# Patient Record
Sex: Female | Born: 1965 | Race: White | Hispanic: No | Marital: Married | Smoking: Never smoker
Health system: Southern US, Community
[De-identification: ages and names within clinical notes are randomized; demographics above are authoritative.]

## PROBLEM LIST (undated history)

## (undated) DIAGNOSIS — M1990 Unspecified osteoarthritis, unspecified site: Secondary | ICD-10-CM

## (undated) DIAGNOSIS — Z8489 Family history of other specified conditions: Secondary | ICD-10-CM

## (undated) DIAGNOSIS — F3281 Premenstrual dysphoric disorder: Secondary | ICD-10-CM

## (undated) DIAGNOSIS — L732 Hidradenitis suppurativa: Secondary | ICD-10-CM

## (undated) DIAGNOSIS — M797 Fibromyalgia: Secondary | ICD-10-CM

## (undated) DIAGNOSIS — K219 Gastro-esophageal reflux disease without esophagitis: Secondary | ICD-10-CM

## (undated) DIAGNOSIS — E785 Hyperlipidemia, unspecified: Secondary | ICD-10-CM

## (undated) DIAGNOSIS — N6019 Diffuse cystic mastopathy of unspecified breast: Secondary | ICD-10-CM

## (undated) DIAGNOSIS — D649 Anemia, unspecified: Secondary | ICD-10-CM

## (undated) DIAGNOSIS — Z9889 Other specified postprocedural states: Secondary | ICD-10-CM

## (undated) DIAGNOSIS — E220 Acromegaly and pituitary gigantism: Secondary | ICD-10-CM

## (undated) DIAGNOSIS — L709 Acne, unspecified: Secondary | ICD-10-CM

## (undated) DIAGNOSIS — G4730 Sleep apnea, unspecified: Secondary | ICD-10-CM

## (undated) DIAGNOSIS — F419 Anxiety disorder, unspecified: Secondary | ICD-10-CM

## (undated) DIAGNOSIS — R112 Nausea with vomiting, unspecified: Secondary | ICD-10-CM

## (undated) DIAGNOSIS — E042 Nontoxic multinodular goiter: Secondary | ICD-10-CM

## (undated) HISTORY — PX: HERNIA REPAIR: SHX51

## (undated) HISTORY — DX: Hyperlipidemia, unspecified: E78.5

## (undated) HISTORY — DX: Diffuse cystic mastopathy of unspecified breast: N60.19

## (undated) HISTORY — DX: Premenstrual dysphoric disorder: F32.81

## (undated) HISTORY — DX: Nontoxic multinodular goiter: E04.2

## (undated) HISTORY — PX: WISDOM TOOTH EXTRACTION: SHX21

## (undated) HISTORY — PX: CHOLECYSTECTOMY: SHX55

## (undated) HISTORY — DX: Acne, unspecified: L70.9

---

## 1998-04-16 ENCOUNTER — Other Ambulatory Visit: Admission: RE | Admit: 1998-04-16 | Discharge: 1998-04-16 | Payer: Self-pay | Admitting: Obstetrics and Gynecology

## 1998-07-08 ENCOUNTER — Inpatient Hospital Stay (HOSPITAL_COMMUNITY): Admission: AD | Admit: 1998-07-08 | Discharge: 1998-07-10 | Payer: Self-pay | Admitting: Obstetrics and Gynecology

## 1998-07-11 ENCOUNTER — Encounter (HOSPITAL_COMMUNITY): Admission: RE | Admit: 1998-07-11 | Discharge: 1998-10-09 | Payer: Self-pay | Admitting: Obstetrics and Gynecology

## 1999-02-14 ENCOUNTER — Other Ambulatory Visit: Admission: RE | Admit: 1999-02-14 | Discharge: 1999-02-14 | Payer: Self-pay | Admitting: Obstetrics and Gynecology

## 2000-04-27 ENCOUNTER — Other Ambulatory Visit: Admission: RE | Admit: 2000-04-27 | Discharge: 2000-04-27 | Payer: Self-pay | Admitting: Obstetrics and Gynecology

## 2001-07-28 ENCOUNTER — Other Ambulatory Visit: Admission: RE | Admit: 2001-07-28 | Discharge: 2001-07-28 | Payer: Self-pay | Admitting: Obstetrics and Gynecology

## 2001-12-13 ENCOUNTER — Encounter (INDEPENDENT_AMBULATORY_CARE_PROVIDER_SITE_OTHER): Payer: Self-pay | Admitting: *Deleted

## 2001-12-13 ENCOUNTER — Ambulatory Visit (HOSPITAL_BASED_OUTPATIENT_CLINIC_OR_DEPARTMENT_OTHER): Admission: RE | Admit: 2001-12-13 | Discharge: 2001-12-13 | Payer: Self-pay | Admitting: General Surgery

## 2001-12-13 ENCOUNTER — Encounter: Payer: Self-pay | Admitting: General Surgery

## 2001-12-13 ENCOUNTER — Encounter: Admission: RE | Admit: 2001-12-13 | Discharge: 2001-12-13 | Payer: Self-pay | Admitting: General Surgery

## 2004-01-04 ENCOUNTER — Other Ambulatory Visit: Admission: RE | Admit: 2004-01-04 | Discharge: 2004-01-04 | Payer: Self-pay | Admitting: Family Medicine

## 2004-01-19 ENCOUNTER — Encounter: Admission: RE | Admit: 2004-01-19 | Discharge: 2004-01-19 | Payer: Self-pay | Admitting: Family Medicine

## 2004-12-08 HISTORY — PX: CARPAL TUNNEL RELEASE: SHX101

## 2005-05-15 ENCOUNTER — Other Ambulatory Visit: Admission: RE | Admit: 2005-05-15 | Discharge: 2005-05-15 | Payer: Self-pay | Admitting: Family Medicine

## 2005-05-15 LAB — HM PAP SMEAR: HM Pap smear: NEGATIVE

## 2005-07-21 ENCOUNTER — Encounter: Admission: RE | Admit: 2005-07-21 | Discharge: 2005-07-21 | Payer: Self-pay | Admitting: Orthopedic Surgery

## 2005-07-23 ENCOUNTER — Ambulatory Visit (HOSPITAL_COMMUNITY): Admission: RE | Admit: 2005-07-23 | Discharge: 2005-07-23 | Payer: Self-pay | Admitting: Orthopedic Surgery

## 2005-07-23 ENCOUNTER — Ambulatory Visit (HOSPITAL_BASED_OUTPATIENT_CLINIC_OR_DEPARTMENT_OTHER): Admission: RE | Admit: 2005-07-23 | Discharge: 2005-07-23 | Payer: Self-pay | Admitting: Orthopedic Surgery

## 2005-12-09 ENCOUNTER — Ambulatory Visit (HOSPITAL_BASED_OUTPATIENT_CLINIC_OR_DEPARTMENT_OTHER): Admission: RE | Admit: 2005-12-09 | Discharge: 2005-12-09 | Payer: Self-pay | Admitting: Orthopedic Surgery

## 2006-08-21 ENCOUNTER — Encounter: Admission: RE | Admit: 2006-08-21 | Discharge: 2006-08-21 | Payer: Self-pay | Admitting: Obstetrics and Gynecology

## 2007-02-23 ENCOUNTER — Ambulatory Visit: Payer: Self-pay | Admitting: Family Medicine

## 2007-04-09 ENCOUNTER — Ambulatory Visit: Payer: Self-pay

## 2007-09-30 ENCOUNTER — Ambulatory Visit: Payer: Self-pay | Admitting: Family Medicine

## 2007-12-13 ENCOUNTER — Ambulatory Visit: Payer: Self-pay | Admitting: Family Medicine

## 2008-08-02 LAB — HM MAMMOGRAPHY

## 2008-08-29 ENCOUNTER — Ambulatory Visit: Payer: Self-pay | Admitting: Family Medicine

## 2008-09-13 ENCOUNTER — Ambulatory Visit: Payer: Self-pay | Admitting: Family Medicine

## 2008-11-17 ENCOUNTER — Ambulatory Visit: Payer: Self-pay | Admitting: Family Medicine

## 2009-05-31 ENCOUNTER — Ambulatory Visit: Payer: Self-pay | Admitting: Family Medicine

## 2009-08-24 ENCOUNTER — Ambulatory Visit: Payer: Self-pay | Admitting: Family Medicine

## 2009-09-19 ENCOUNTER — Ambulatory Visit: Payer: Self-pay | Admitting: Family Medicine

## 2010-11-06 ENCOUNTER — Ambulatory Visit: Payer: Self-pay | Admitting: Family Medicine

## 2010-11-26 ENCOUNTER — Ambulatory Visit: Payer: Self-pay | Admitting: Family Medicine

## 2011-02-05 ENCOUNTER — Other Ambulatory Visit (HOSPITAL_COMMUNITY): Payer: Self-pay | Admitting: Obstetrics and Gynecology

## 2011-02-05 DIAGNOSIS — R140 Abdominal distension (gaseous): Secondary | ICD-10-CM

## 2011-02-05 DIAGNOSIS — R1011 Right upper quadrant pain: Secondary | ICD-10-CM

## 2011-02-14 ENCOUNTER — Ambulatory Visit (HOSPITAL_COMMUNITY)
Admission: RE | Admit: 2011-02-14 | Discharge: 2011-02-14 | Disposition: A | Discharge status: Home / Self Care | Payer: BC Managed Care – PPO | Source: Ambulatory Visit | Attending: Obstetrics and Gynecology | Admitting: Obstetrics and Gynecology

## 2011-02-14 DIAGNOSIS — R142 Eructation: Secondary | ICD-10-CM | POA: Insufficient documentation

## 2011-02-14 DIAGNOSIS — R141 Gas pain: Secondary | ICD-10-CM | POA: Insufficient documentation

## 2011-02-14 DIAGNOSIS — R140 Abdominal distension (gaseous): Secondary | ICD-10-CM

## 2011-02-14 DIAGNOSIS — R1011 Right upper quadrant pain: Secondary | ICD-10-CM | POA: Insufficient documentation

## 2011-04-25 NOTE — Op Note (Signed)
[place]  Patient:    Rebecca Vargas, Rebecca Vargas Visit Number: [id number] MRN: [id number]          Service Type: DSU Location: [facility] Attending Physician:  Tempie Donning Dictated by:   Gita Kudo, M.D. Proc. Date: 12/13/01 Admit Date:  12/13/2001   CC:         Elpidio Galea, P.A., office of Dr. Sharlot Gowda  Ronnald Nian, M.D.  Dr. Stephani Police, [facility] Radiology   Operative Report  PREOPERATIVE DIAGNOSIS:  Left breast mass, probably benign.  POSTOPERATIVE DIAGNOSIS:  Left breast mass, probably benign, pending pathology.  PROCEDURE:  Excisional biopsy, left breast mass.  SURGEON:  Gita Kudo, M.D.  ANESTHESIA:  MAC, IV sedation, local 1% sedation.  CLINICAL SUMMARY:  A 45 year old female with a small nodule palpated in her left breast.  Had her first mammogram ever that was basically negative except for some density in the UOQ on the left, and ultrasound showed a nodule there. It looked like a benign fibroadenoma, and options given to the patient and she requested total excision.  OPERATIVE FINDINGS:  The lesion was localized by wire at the breast center. Then the patient underwent intravenous sedation and her breast was prepped and draped.  DESCRIPTION OF PROCEDURE:  After suitable prepping and draping, a curved incision made centered over the marked area by radiology in the UOQ on the left.  Then in the breast tissue, a dissection made to find the wire, which entered at approximately 6 oclock.  This was found, cut close to the skin, and pulled through.  Then using the wire as a guide, a core of tissue around the wire and deep to the tip was excised.  This was then marked with a suture and sent for ultrasound confirmation.  The wound meanwhile was lavaged with saline, made dry by cautery, and closed with 3-0 Vicryl and 4-0 nylon.  A sterile absorbent dressing was applied when the report came back from x-ray that the  lesion had been removed.  There were no complications. Dictated by:   Gita Kudo, M.D. Attending Physician:  Tempie Donning DD:  12/13/01 TD:  12/13/01 Job: [id number]

## 2011-04-25 NOTE — Op Note (Signed)
NAME:  Rebecca Vargas, Rebecca Vargas                ACCOUNT NO.:  [id number]   MEDICAL RECORD NO.:  [id number]          PATIENT TYPE:  AMB   LOCATION:  DSC                          FACILITY:  [facility]   PHYSICIAN:  Cindee Salt, M.D.       DATE OF BIRTH:  [birth year]   DATE OF PROCEDURE:  07/23/2005  DATE OF DISCHARGE:                                 OPERATIVE REPORT   PREOPERATIVE DIAGNOSIS:  Carpal tunnel syndrome, left hand.   POSTOPERATIVE DIAGNOSIS:  Carpal tunnel syndrome, left hand.   OPERATION:  Decompression left median nerve.   SURGEON:  Kuzma.   ASSISTANT:  Carolyne Fiscal R.N.   ANESTHESIA:  Forearm based IV regional.   HISTORY:  The patient is a 44 year old female with a history of carpal  tunnel syndrome EMG nerve conductions positive. This has not responded to  conservative treatment. She has elected to have this released. She is aware  of risks and complications.  The patient was marked in the recovery room by  both surgeon and the patient.  She was brought to the operating room where a  forearm based IV regional anesthetic was carried out without difficulty. She  was prepped using DuraPrep, supine position, left arm free. A longitudinal  incision was made in the palm and carried down through subcutaneous tissue.  Bleeders were electrocauterized. Palmar fascia was split, superficial palmar  arch identified.  Flexor tendon to the ring and little finger identified to  the ulnar side of the median nerve.  The carpal retinaculum was incised with  sharp dissection. The right angle and Sewell retractor were placed between  skin forearm fascia. The fascia was released for approximately a centimeter  and a half proximal to the wrist crease under direct vision. Canal was  explored.  Tenosynovial tissue moderately thickened.  No further lesions  were identified. The wound was irrigated. Skin was closed with interrupted 5-  0 nylon sutures. A sterile compressive dressing and splint was applied. The  patient tolerated procedure well and was taken to the recovery room for  observation in satisfactory condition. She is discharged home to return to  the [facility] of [place] in one week on Vicodin.           ______________________________  Cindee Salt, M.D.     GK/MEDQ  D:  07/23/2005  T:  07/23/2005  Job:  [id number]

## 2011-04-25 NOTE — Op Note (Signed)
NAME:  Rebecca Vargas, Rebecca Vargas                ACCOUNT NO.:  [id number]   MEDICAL RECORD NO.:  [id number]          PATIENT TYPE:  AMB   LOCATION:  DSC                          FACILITY:  [facility]   PHYSICIAN:  Cindee Salt, M.D.       DATE OF BIRTH:  09/15/1966   DATE OF PROCEDURE:  12/09/2005  DATE OF DISCHARGE:                                 OPERATIVE REPORT   PREOPERATIVE DIAGNOSIS:  Carpal tunnel syndrome, right hand.   POSTOPERATIVE DIAGNOSIS:  Carpal tunnel syndrome, right hand.   OPERATION:  Decompression of right median nerve.   SURGEON:  Cindee Salt, M.D.   ASSISTANT:  Joaquin Courts R.N.   ANESTHESIA:  Forearm-based IV regional.   HISTORY:  The patient is a 45 year old female with a history of carpal  tunnel syndrome, EMG nerve conductions positive.  This has not responded to  conservative treatment.   PROCEDURE:  The patient's arm was marked by both the patient and surgeon in  the preoperative area.  She was taken to the operating room, where a forearm-  based IV regional anesthetic was carried out without difficulty.  She was  prepped using DuraPrep, supine position, right arm free.  A longitudinal  incision was made in the palm, carried down through subcutaneous tissue.  Bleeders were electrocauterized.  Palmar fascia was split, superficial  palmar arch identified, the flexor tendon to the ring and little finger  identified to the ulnar side of the median nerve.  The carpal retinaculum  was incised with sharp dissection, right angle and Sewall retractor were  placed between skin and forearm fascia.  The fascia was released for  approximately 1.5 cm proximal to the wrist crease under direct vision.  The  canal was explored, tenosynovial tissue was thickened.  Persistent median  artery was present.  No further lesions were identified.  The wound was  irrigated.  The skin was closed with interrupted 5-0 nylon sutures.  A  sterile compressive dressing and splint was applied.  The patient  tolerated  the procedure well and was taken to the recovery room for observation in  satisfactory condition.  She is discharged home to return to the [facility] [place] in one week on Vicodin.           ______________________________  Cindee Salt, M.D.     GK/MEDQ  D:  12/09/2005  T:  12/09/2005  Job:  [id number]

## 2011-09-26 ENCOUNTER — Other Ambulatory Visit (INDEPENDENT_AMBULATORY_CARE_PROVIDER_SITE_OTHER): Payer: BC Managed Care – PPO

## 2011-09-26 DIAGNOSIS — Z23 Encounter for immunization: Secondary | ICD-10-CM

## 2011-10-15 ENCOUNTER — Ambulatory Visit (INDEPENDENT_AMBULATORY_CARE_PROVIDER_SITE_OTHER): Payer: BC Managed Care – PPO | Admitting: Medical

## 2011-10-15 ENCOUNTER — Ambulatory Visit
Admission: RE | Admit: 2011-10-15 | Discharge: 2011-10-15 | Disposition: A | Discharge status: Home / Self Care | Payer: BC Managed Care – PPO | Source: Ambulatory Visit | Attending: Medical | Admitting: Medical

## 2011-10-15 ENCOUNTER — Encounter: Payer: Self-pay | Admitting: Medical

## 2011-10-15 ENCOUNTER — Encounter: Payer: Self-pay | Admitting: Family Medicine

## 2011-10-15 ENCOUNTER — Telehealth: Payer: Self-pay | Admitting: Family Medicine

## 2011-10-15 VITALS — BP 120/82 | HR 72 | Temp 98.1°F | Resp 18 | Wt 181.0 lb

## 2011-10-15 DIAGNOSIS — G43909 Migraine, unspecified, not intractable, without status migrainosus: Secondary | ICD-10-CM

## 2011-10-15 DIAGNOSIS — M542 Cervicalgia: Secondary | ICD-10-CM | POA: Insufficient documentation

## 2011-10-15 MED ORDER — BACLOFEN 10 MG PO TABS: ORAL_TABLET | ORAL | Status: DC
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
## 2011-10-16 NOTE — Telephone Encounter (Signed)
DONE

## 2012-10-01 ENCOUNTER — Other Ambulatory Visit (INDEPENDENT_AMBULATORY_CARE_PROVIDER_SITE_OTHER): Payer: BC Managed Care – PPO

## 2012-10-01 DIAGNOSIS — Z23 Encounter for immunization: Secondary | ICD-10-CM

## 2013-06-03 ENCOUNTER — Ambulatory Visit (INDEPENDENT_AMBULATORY_CARE_PROVIDER_SITE_OTHER): Payer: BC Managed Care – PPO | Admitting: Medical

## 2013-06-03 ENCOUNTER — Encounter: Payer: Self-pay | Admitting: Medical

## 2013-06-03 VITALS — BP 110/80 | HR 68 | Temp 98.4°F | Resp 16 | Wt 190.0 lb

## 2013-06-03 DIAGNOSIS — F40298 Other specified phobia: Secondary | ICD-10-CM

## 2013-06-03 DIAGNOSIS — F40243 Fear of flying: Secondary | ICD-10-CM

## 2013-06-03 DIAGNOSIS — F430 Acute stress reaction: Secondary | ICD-10-CM

## 2013-06-03 MED ORDER — ALPRAZOLAM 0.5 MG PO TABS: 0.5000 mg | ORAL_TABLET | Freq: Every evening | ORAL | Status: DC | PRN

## 2013-06-03 NOTE — Progress Notes (Signed)
Subjective: Here for request for short term Xanax.   In the past use to have Xanax prescription for rare stressful occasions.   Her family and several other families are leaving for a vacation soon in [place] where they will be sharing housing.   In general she doesn't like getting on planes or be trapped with family or others for length of time.  This makes her stressed and on edge.   In addition, they are all working to have an itinerary, and husband usually pouts if things don't go as planned even though she is the one responsible with putting together hotel, car, etc.  Last used 3 tablets recent for dental work through [employer], [facility], but prior to that last used Xanax 3 years ago.  Takes her Celexa daily, this works well.  Her gynecologist prescribed the Celexa.    Past Medical History  Diagnosis Date  . Dyslipidemia   . PMDD (premenstrual dysphoric disorder)   . Acne   . Fibrocystic breast disease in female   . Multinodular goiter   . Migraine    ROS as in subjective  Objective: Gen: wd, wn, nad Psych: pleasant, good eye contact  Assessment: Encounter Diagnoses  Name Primary?  . Acute stress reaction Yes  . Fear of flying     Plan: Short term prn use of xanax prescribed, counseled on ways to make this a vacation and not a stressful event, counseled about stress with flying.  Her gynecologist prescribed her Celexa. Advised that the xanax is short term, and not meant to be an ongoing prescription.

## 2013-10-03 ENCOUNTER — Other Ambulatory Visit (INDEPENDENT_AMBULATORY_CARE_PROVIDER_SITE_OTHER): Payer: BC Managed Care – PPO

## 2013-10-03 DIAGNOSIS — Z23 Encounter for immunization: Secondary | ICD-10-CM

## 2014-09-18 ENCOUNTER — Other Ambulatory Visit: Payer: BC Managed Care – PPO

## 2014-09-19 ENCOUNTER — Other Ambulatory Visit (INDEPENDENT_AMBULATORY_CARE_PROVIDER_SITE_OTHER): Payer: BC Managed Care – PPO

## 2014-09-19 DIAGNOSIS — Z23 Encounter for immunization: Secondary | ICD-10-CM

## 2016-09-01 ENCOUNTER — Other Ambulatory Visit (INDEPENDENT_AMBULATORY_CARE_PROVIDER_SITE_OTHER): Payer: BLUE CROSS/BLUE SHIELD

## 2016-09-01 DIAGNOSIS — Z23 Encounter for immunization: Secondary | ICD-10-CM

## 2019-11-18 ENCOUNTER — Other Ambulatory Visit: Payer: Self-pay

## 2019-11-18 DIAGNOSIS — Z20822 Contact with and (suspected) exposure to covid-19: Secondary | ICD-10-CM

## 2019-11-20 LAB — NOVEL CORONAVIRUS, NAA: SARS-CoV-2, NAA: NOT DETECTED

## 2020-01-02 ENCOUNTER — Other Ambulatory Visit: Payer: Self-pay | Admitting: Sports Medicine

## 2020-01-02 DIAGNOSIS — G8929 Other chronic pain: Secondary | ICD-10-CM

## 2020-01-02 DIAGNOSIS — M545 Low back pain, unspecified: Secondary | ICD-10-CM

## 2020-01-05 ENCOUNTER — Ambulatory Visit
Admission: RE | Admit: 2020-01-05 | Discharge: 2020-01-05 | Disposition: A | Discharge status: Home / Self Care | Payer: 59 | Source: Ambulatory Visit | Attending: Sports Medicine | Admitting: Sports Medicine

## 2020-01-05 DIAGNOSIS — G8929 Other chronic pain: Secondary | ICD-10-CM

## 2020-01-05 MED ORDER — METHYLPREDNISOLONE ACETATE 40 MG/ML INJ SUSP (RADIOLOG
120.0000 mg | Freq: Once | INTRAMUSCULAR | Status: AC
  Administered 2020-01-05: 120 mg via EPIDURAL

## 2020-01-05 MED ORDER — IOPAMIDOL (ISOVUE-M 200) INJECTION 41%
1.0000 mL | Freq: Once | INTRAMUSCULAR | Status: AC
  Administered 2020-01-05: 1 mL via EPIDURAL

## 2020-01-05 NOTE — Discharge Instructions (Signed)

## 2020-01-17 ENCOUNTER — Other Ambulatory Visit: Payer: Self-pay | Admitting: Sports Medicine

## 2020-01-17 DIAGNOSIS — G8929 Other chronic pain: Secondary | ICD-10-CM

## 2020-01-23 ENCOUNTER — Other Ambulatory Visit: Payer: Self-pay

## 2020-01-23 ENCOUNTER — Ambulatory Visit
Admission: RE | Admit: 2020-01-23 | Discharge: 2020-01-23 | Disposition: A | Discharge status: Home / Self Care | Payer: 59 | Source: Ambulatory Visit | Attending: Sports Medicine | Admitting: Sports Medicine

## 2020-01-23 DIAGNOSIS — G8929 Other chronic pain: Secondary | ICD-10-CM

## 2020-01-23 MED ORDER — METHYLPREDNISOLONE ACETATE 40 MG/ML INJ SUSP (RADIOLOG
120.0000 mg | Freq: Once | INTRAMUSCULAR | Status: AC
  Administered 2020-01-23: 10:00:00 120 mg via EPIDURAL

## 2020-01-23 MED ORDER — IOPAMIDOL (ISOVUE-M 200) INJECTION 41%
1.0000 mL | Freq: Once | INTRAMUSCULAR | Status: AC
  Administered 2020-01-23: 1 mL via EPIDURAL

## 2021-01-15 ENCOUNTER — Other Ambulatory Visit: Payer: Self-pay | Admitting: Obstetrics and Gynecology

## 2021-01-21 ENCOUNTER — Other Ambulatory Visit: Payer: Self-pay | Admitting: Obstetrics and Gynecology

## 2021-01-21 DIAGNOSIS — N289 Disorder of kidney and ureter, unspecified: Secondary | ICD-10-CM

## 2021-01-29 ENCOUNTER — Ambulatory Visit
Admission: RE | Admit: 2021-01-29 | Discharge: 2021-01-29 | Disposition: A | Discharge status: Home / Self Care | Payer: 59 | Source: Ambulatory Visit | Attending: Obstetrics and Gynecology | Admitting: Obstetrics and Gynecology

## 2021-01-29 ENCOUNTER — Other Ambulatory Visit: Payer: Self-pay | Admitting: Orthopaedic Surgery

## 2021-01-29 DIAGNOSIS — Z01818 Encounter for other preprocedural examination: Secondary | ICD-10-CM

## 2021-01-29 DIAGNOSIS — N289 Disorder of kidney and ureter, unspecified: Secondary | ICD-10-CM

## 2021-02-01 NOTE — Progress Notes (Addendum)
COVID Vaccine Completed:  x3 Date COVID Vaccine completed: Has received booster:  11-21 COVID vaccine manufacturer: Pfizer    [facility] [employer]   Date of COVID positive in last 90 days:    PCP - Smitty Cords [place] Cardiologist - N/A  Chest x-ray - 02-05-21 Epic EKG - 02-04-21 [place] Stress Test -  ECHO - 2016 to evaluate heart murmur Cardiac Cath -  Pacemaker/ICD device last checked:  Sleep Study - 10-05-18 Care Everywhere, +sleep apnea CPAP - yes  Fasting Blood Sugar - N/A Checks Blood Sugar _____ times a day  Blood Thinner Instructions:  N/A Aspirin Instructions: Last Dose:  Activity level:  Can go up a flight of stairs and perform activities of daily living without stopping and without symptoms        Anesthesia review:  N/A  Patient denies shortness of breath, fever, cough and chest pain at PAT appointment   Patient verbalized understanding of instructions that were given to them at the PAT appointment. Patient was also instructed that they will need to review over the PAT instructions again at home before surgery.

## 2021-02-01 NOTE — Progress Notes (Signed)
Please enter orders for PAT visit scheduled for 02-05-21. 

## 2021-02-01 NOTE — Patient Instructions (Addendum)
DUE TO COVID-19 ONLY ONE VISITOR IS ALLOWED TO COME WITH YOU AND STAY IN THE WAITING ROOM ONLY DURING PRE OP AND PROCEDURE.   IF YOU WILL BE ADMITTED INTO THE HOSPITAL YOU ARE ALLOWED ONLY TWO SUPPORT PEOPLE DURING VISITATION HOURS ONLY (10AM -8PM)   . The support person(s) may change daily. . The support person(s) must pass our screening, gel in and out, and wear a mask at all times, including in the patient's room. . Patients must also wear a mask when staff or their support person are in the room.  No visitors under the age of 55. Any visitor under the age of 55 must be accompanied by an adult.    COVID SWAB TESTING MUST BE COMPLETED ON:   Friday, 02-08-21 @ 2:50 PM   [address] [place], [place] [id number]  (Must self quarantine after testing. Follow instructions on handout.)         Your procedure is scheduled on:  Tuesday, 02-12-21   Report to [facility] Main  Entrance    Report to admitting at 10:00 AM   Call this number if you have problems the morning of surgery [phone]   Do not eat food :After Midnight.   May have liquids until 9:15 AM day of surgery  CLEAR LIQUID DIET  Foods Allowed                                                                     Foods Excluded  Water, Black Coffee and tea, regular and decaf              liquids that you cannot  Plain Jell-O in any flavor  (No red)                                     see through such as: Fruit ices (not with fruit pulp)                                      milk, soups, orange juice              Iced Popsicles (No red)                                      All solid food                                   Apple juices Sports drinks like Gatorade (No red) Lightly seasoned clear broth or consume(fat free) Sugar, honey syrup     Complete one Ensure drink the morning of surgery at 9:15 AM  the day of surgery.        1. The day of surgery:  ? Drink ONE (1) Pre-Surgery Clear Ensure or G2 by am the  morning of surgery. Drink in one sitting. Do not sip.  ? This drink was given to you during your hospital  pre-op appointment  visit. ? Nothing else to drink after completing the  Pre-Surgery Clear Ensure or G2.          If you have questions, please contact your surgeon's office.     Oral Hygiene is also important to reduce your risk of infection.                                    Remember - BRUSH YOUR TEETH THE MORNING OF SURGERY WITH YOUR REGULAR TOOTHPASTE   Do NOT smoke after Midnight   Take these medicines the morning of surgery with A SIP OF WATER:  Duloxetine, Gabapentin, Omeprazole, Topiramate                              You may not have any metal on your body including hair pins, jewelry, and body piercings             Do not wear make-up, lotions, powders, perfumes/cologne, or deodorant             Do not wear nail polish.  Do not shave  48 hours prior to surgery.               Do not bring valuables to the hospital. [facility] IS NOT             RESPONSIBLE   FOR VALUABLES.   Contacts, dentures or bridgework may not be worn into surgery.    Patients discharged the day of surgery will not be allowed to drive home.   Special Instructions: Bring a copy of your healthcare power of attorney and living will documents         the day of surgery if you haven't  scanned them in before.              Please read over the following fact sheets you were given: IF YOU HAVE QUESTIONS ABOUT YOUR PRE OP INSTRUCTIONS PLEASE CALL  [phone] [facility] - Preparing for Surgery Before surgery, you can play an important role.  Because skin is not sterile, your skin needs to be as free of germs as possible.  You can reduce the number of germs on your skin by washing with CHG (chlorahexidine gluconate) soap before surgery.  CHG is an antiseptic cleaner which kills germs and bonds with the skin to continue killing germs even after washing. Please DO NOT use if you have an allergy  to CHG or antibacterial soaps.  If your skin becomes reddened/irritated stop using the CHG and inform your nurse when you arrive at Short Stay. Do not shave (including legs and underarms) for at least 48 hours prior to the first CHG shower.  You may shave your face/neck.  Please follow these instructions carefully:  1.  Shower with CHG Soap the night before surgery and the  morning of surgery.  2.  If you choose to wash your hair, wash your hair first as usual with your normal  shampoo.  3.  After you shampoo, rinse your hair and body thoroughly to remove the shampoo.                             4.  Use CHG as you would any other liquid soap.  You can apply chg directly to the skin  and wash.  Gently with a scrungie or clean washcloth.  5.  Apply the CHG Soap to your body ONLY FROM THE NECK DOWN.   Do   not use on face/ open                           Wound or open sores. Avoid contact with eyes, ears mouth and   genitals (private parts).                       Wash face,  Genitals (private parts) with your normal soap.             6.  Wash thoroughly, paying special attention to the area where your    surgery  will be performed.  7.  Thoroughly rinse your body with warm water from the neck down.  8.  DO NOT shower/wash with your normal soap after using and rinsing off the CHG Soap.                9.  Pat yourself dry with a clean towel.            10.  Wear clean pajamas.            11.  Place clean sheets on your bed the night of your first shower and do not  sleep with pets. Day of Surgery : Do not apply any lotions/deodorants the morning of surgery.  Please wear clean clothes to the hospital/surgery center.  FAILURE TO FOLLOW THESE INSTRUCTIONS MAY RESULT IN THE CANCELLATION OF YOUR SURGERY  PATIENT SIGNATURE_________________________________  NURSE SIGNATURE__________________________________  ________________________________________________________________________   Rogelia Mire  An incentive spirometer is a tool that can help keep your lungs clear and active. This tool measures how well you are filling your lungs with each breath. Taking long deep breaths may help reverse or decrease the chance of developing breathing (pulmonary) problems (especially infection) following:  A long period of time when you are unable to move or be active. BEFORE THE PROCEDURE   If the spirometer includes an indicator to show your best effort, your nurse or respiratory therapist will set it to a desired goal.  If possible, sit up straight or lean slightly forward. Try not to slouch.  Hold the incentive spirometer in an upright position. INSTRUCTIONS FOR USE  1. Sit on the edge of your bed if possible, or sit up as far as you can in bed or on a chair. 2. Hold the incentive spirometer in an upright position. 3. Breathe out normally. 4. Place the mouthpiece in your mouth and seal your lips tightly around it. 5. Breathe in slowly and as deeply as possible, raising the piston or the ball toward the top of the column. 6. Hold your breath for 3-5 seconds or for as long as possible. Allow the piston or ball to fall to the bottom of the column. 7. Remove the mouthpiece from your mouth and breathe out normally. 8. Rest for a few seconds and repeat Steps 1 through 7 at least 10 times every 1-2 hours when you are awake. Take your time and take a few normal breaths between deep breaths. 9. The spirometer may include an indicator to show your best effort. Use the indicator as a goal to work toward during each repetition. 10. After each set of 10 deep breaths, practice coughing to be sure your lungs are clear. If you have  an incision (the cut made at the time of surgery), support your incision when coughing by placing a pillow or rolled up towels firmly against it. Once you are able to get out of bed, walk around indoors and cough well. You may stop using the incentive spirometer when  instructed by your caregiver.  RISKS AND COMPLICATIONS  Take your time so you do not get dizzy or light-headed.  If you are in pain, you may need to take or ask for pain medication before doing incentive spirometry. It is harder to take a deep breath if you are having pain. AFTER USE  Rest and breathe slowly and easily.  It can be helpful to keep track of a log of your progress. Your caregiver can provide you with a simple table to help with this. If you are using the spirometer at home, follow these instructions: SEEK MEDICAL CARE IF:   You are having difficultly using the spirometer.  You have trouble using the spirometer as often as instructed.  Your pain medication is not giving enough relief while using the spirometer.  You develop fever of 100.5 F (38.1 C) or higher. SEEK IMMEDIATE MEDICAL CARE IF:   You cough up bloody sputum that had not been present before.  You develop fever of 102 F (38.9 C) or greater.  You develop worsening pain at or near the incision site. MAKE SURE YOU:   Understand these instructions.  Will watch your condition.  Will get help right away if you are not doing well or get worse. Document Released: 04/06/2007 Document Revised: 02/16/2012 Document Reviewed: 06/07/2007 Exit[employer] Patient Information 2014 [employer], [place].   ________________________________________________________________________  WHAT IS A BLOOD TRANSFUSION? Blood Transfusion Information  A transfusion is the replacement of blood or some of its parts. Blood is made up of multiple cells which provide different functions.  Red blood cells carry oxygen and are used for blood loss replacement.  White blood cells fight against infection.  Platelets control bleeding.  Plasma helps clot blood.  Other blood products are available for specialized needs, such as hemophilia or other clotting disorders. BEFORE THE TRANSFUSION  Who gives blood for transfusions?   Healthy  volunteers who are fully evaluated to make sure their blood is safe. This is blood bank blood. Transfusion therapy is the safest it has ever been in the practice of medicine. Before blood is taken from a donor, a complete history is taken to make sure that person has no history of diseases nor engages in risky social behavior (examples are intravenous drug use or sexual activity with multiple partners). The donor's travel history is screened to minimize risk of transmitting infections, such as malaria. The donated blood is tested for signs of infectious diseases, such as HIV and hepatitis. The blood is then tested to be sure it is compatible with you in order to minimize the chance of a transfusion reaction. If you or a relative donates blood, this is often done in anticipation of surgery and is not appropriate for emergency situations. It takes many days to process the donated blood. RISKS AND COMPLICATIONS Although transfusion therapy is very safe and saves many lives, the main dangers of transfusion include:   Getting an infectious disease.  Developing a transfusion reaction. This is an allergic reaction to something in the blood you were given. Every precaution is taken to prevent this. The decision to have a blood transfusion has been considered carefully by your caregiver before blood is given. Blood is not  given unless the benefits outweigh the risks. AFTER THE TRANSFUSION  Right after receiving a blood transfusion, you will usually feel much better and more energetic. This is especially true if your red blood cells have gotten low (anemic). The transfusion raises the level of the red blood cells which carry oxygen, and this usually causes an energy increase.  The nurse administering the transfusion will monitor you carefully for complications. HOME CARE INSTRUCTIONS  No special instructions are needed after a transfusion. You may find your energy is better. Speak with your caregiver about any  limitations on activity for underlying diseases you may have. SEEK MEDICAL CARE IF:   Your condition is not improving after your transfusion.  You develop redness or irritation at the intravenous (IV) site. SEEK IMMEDIATE MEDICAL CARE IF:  Any of the following symptoms occur over the next 12 hours:  Shaking chills.  You have a temperature by mouth above 102 F (38.9 C), not controlled by medicine.  Chest, back, or muscle pain.  People around you feel you are not acting correctly or are confused.  Shortness of breath or difficulty breathing.  Dizziness and fainting.  You get a rash or develop hives.  You have a decrease in urine output.  Your urine turns a dark color or changes to pink, red, or brown. Any of the following symptoms occur over the next 10 days:  You have a temperature by mouth above 102 F (38.9 C), not controlled by medicine.  Shortness of breath.  Weakness after normal activity.  The white part of the eye turns yellow (jaundice).  You have a decrease in the amount of urine or are urinating less often.  Your urine turns a dark color or changes to pink, red, or brown. Document Released: 11/21/2000 Document Revised: 02/16/2012 Document Reviewed: 07/10/2008 [facility] Patient Information 2014 [place], [place].  _______________________________________________________________________

## 2021-02-05 ENCOUNTER — Encounter (HOSPITAL_COMMUNITY): Payer: Self-pay

## 2021-02-05 ENCOUNTER — Other Ambulatory Visit: Payer: Self-pay

## 2021-02-05 ENCOUNTER — Encounter (HOSPITAL_COMMUNITY)
Admission: RE | Admit: 2021-02-05 | Discharge: 2021-02-05 | Disposition: A | Discharge status: Home / Self Care | Payer: 59 | Source: Ambulatory Visit | Attending: Orthopaedic Surgery | Admitting: Orthopaedic Surgery

## 2021-02-05 ENCOUNTER — Ambulatory Visit (HOSPITAL_COMMUNITY)
Admission: RE | Admit: 2021-02-05 | Discharge: 2021-02-05 | Disposition: A | Discharge status: Home / Self Care | Payer: 59 | Source: Ambulatory Visit | Attending: Orthopaedic Surgery | Admitting: Orthopaedic Surgery

## 2021-02-05 DIAGNOSIS — Z01818 Encounter for other preprocedural examination: Secondary | ICD-10-CM

## 2021-02-05 HISTORY — DX: Anxiety disorder, unspecified: F41.9

## 2021-02-05 HISTORY — DX: Gastro-esophageal reflux disease without esophagitis: K21.9

## 2021-02-05 HISTORY — DX: Anemia, unspecified: D64.9

## 2021-02-05 HISTORY — DX: Unspecified osteoarthritis, unspecified site: M19.90

## 2021-02-05 HISTORY — DX: Sleep apnea, unspecified: G47.30

## 2021-02-05 LAB — PROTIME-INR
INR: 1 (ref 0.8–1.2)
Prothrombin Time: 12.7 seconds (ref 11.4–15.2)

## 2021-02-05 LAB — APTT: aPTT: 29 seconds (ref 24–36)

## 2021-02-05 LAB — SURGICAL PCR SCREEN
MRSA, PCR: NEGATIVE
Staphylococcus aureus: POSITIVE — AB

## 2021-02-05 NOTE — Progress Notes (Signed)
PCR sent to Dr. Jerl Santos to review.

## 2021-02-08 ENCOUNTER — Other Ambulatory Visit (HOSPITAL_COMMUNITY)
Admission: RE | Admit: 2021-02-08 | Discharge: 2021-02-08 | Disposition: A | Discharge status: Home / Self Care | Payer: 59 | Source: Ambulatory Visit | Attending: Orthopaedic Surgery | Admitting: Orthopaedic Surgery

## 2021-02-08 DIAGNOSIS — Z01812 Encounter for preprocedural laboratory examination: Secondary | ICD-10-CM | POA: Insufficient documentation

## 2021-02-08 DIAGNOSIS — Z20822 Contact with and (suspected) exposure to covid-19: Secondary | ICD-10-CM | POA: Diagnosis not present

## 2021-02-08 LAB — SARS CORONAVIRUS 2 (TAT 6-24 HRS): SARS Coronavirus 2: NEGATIVE

## 2021-02-11 MED ORDER — BUPIVACAINE LIPOSOME 1.3 % IJ SUSP
10.0000 mL | Freq: Once | INTRAMUSCULAR | Status: DC
  Filled 2021-02-11: qty 10

## 2021-02-11 MED ORDER — TRANEXAMIC ACID 1000 MG/10ML IV SOLN
2000.0000 mg | INTRAVENOUS | Status: DC
  Filled 2021-02-11: qty 20

## 2021-02-11 NOTE — H&P (Signed)
TOTAL HIP ADMISSION H&P  Patient is admitted for right total hip arthroplasty.  Subjective:  Chief Complaint: right hip pain  HPI: Rebecca Vargas, 55 y.o. female, has a history of pain and functional disability in the right hip(s) due to arthritis and patient has failed non-surgical conservative treatments for greater than 12 weeks to include NSAID's and/or analgesics, corticosteriod injections, flexibility and strengthening excercises, supervised PT with diminished ADL's post treatment, use of assistive devices, weight reduction as appropriate and activity modification.  Onset of symptoms was gradual starting 5 years ago with gradually worsening course since that time.The patient noted no past surgery on the right hip(s).  Patient currently rates pain in the right hip at 10 out of 10 with activity. Patient has night pain, worsening of pain with activity and weight bearing, trendelenberg gait, pain that interfers with activities of daily living and crepitus. Patient has evidence of subchondral cysts, subchondral sclerosis, periarticular osteophytes and joint space narrowing by imaging studies. This condition presents safety issues increasing the risk of falls. There is no current active infection.  Patient Active Problem List   Diagnosis Date Noted  . Cervicalgia 10/15/2011   Past Medical History:  Diagnosis Date  . Acne   . Anemia   . Anxiety   . Arthritis   . Dyslipidemia   . Fibrocystic breast disease in female   . GERD (gastroesophageal reflux disease)   . Migraine   . Multinodular goiter    Pt denies  . PMDD (premenstrual dysphoric disorder)   . Sleep apnea     Past Surgical History:  Procedure Laterality Date  . CARPAL TUNNEL RELEASE  2006  . CHOLECYSTECTOMY    . HERNIA REPAIR     Age 89  . WISDOM TOOTH EXTRACTION      Current Facility-Administered Medications  Medication Dose Route Frequency Provider Last Rate Last Admin  . [START ON 02/12/2021] bupivacaine liposome  (EXPAREL) 1.3 % injection 133 mg  10 mL Other Once Marcene Corning, MD      . Melene Muller ON 02/12/2021] tranexamic acid (CYKLOKAPRON) 2,000 mg in sodium chloride 0.9 % 50 mL Topical Application  2,000 mg Topical To OR Marcene Corning, MD       Current Outpatient Medications  Medication Sig Dispense Refill Last Dose  . cetirizine (ZYRTEC) 10 MG tablet Take 10 mg by mouth daily.     . diclofenac Sodium (VOLTAREN) 1 % GEL Apply 2 g topically daily as needed (pain).     . DULoxetine (CYMBALTA) 30 MG capsule Take 30 mg by mouth daily.     . DULoxetine (CYMBALTA) 60 MG capsule Take 60 mg by mouth daily.     Marland Kitchen estradiol (ESTRACE) 0.5 MG tablet Take 0.5 mg by mouth daily.     Marland Kitchen gabapentin (NEURONTIN) 100 MG capsule Take 300 mg by mouth 3 (three) times daily as needed (pain).     . hydroxypropyl methylcellulose / hypromellose (ISOPTO TEARS / GONIOVISC) 2.5 % ophthalmic solution Place 1 drop into both eyes 3 (three) times daily as needed for dry eyes.     Marland Kitchen ibuprofen (ADVIL) 200 MG tablet Take 800 mg by mouth every 8 (eight) hours as needed for headache or moderate pain.     . meloxicam (MOBIC) 15 MG tablet Take 15 mg by mouth at bedtime.     Marland Kitchen omeprazole (PRILOSEC) 40 MG capsule Take 40 mg by mouth daily.     . rizatriptan (MAXALT) 5 MG tablet Take 5 mg by mouth as  needed for migraine. May repeat in 2 hours if needed     . spironolactone (ALDACTONE) 100 MG tablet Take 100 mg by mouth daily.     Marland Kitchen tiZANidine (ZANAFLEX) 4 MG tablet Take 4 mg by mouth at bedtime.     . topiramate (TOPAMAX) 50 MG tablet Take 50 mg by mouth in the morning and at bedtime.     . ALPRAZolam (XANAX) 0.5 MG tablet Take 1 tablet (0.5 mg total) by mouth at bedtime as needed for sleep. (Patient not taking: No sig reported) 30 tablet 0 Not Taking at Unknown time  . baclofen (LIORESAL) 10 MG tablet 1/2 - 1 tablet QHS or up to 3x/day for acute spasm (Patient not taking: No sig reported) 30 each 0 Not Taking at Unknown time  . citalopram  (CELEXA) 10 MG tablet Take 10 mg by mouth daily. (Patient not taking: No sig reported)   Not Taking at Unknown time  . drospirenone-ethinyl estradiol (YAZ,GIANVI,LORYNA) 3-0.02 MG tablet Take 1 tablet by mouth daily.   (Patient not taking: No sig reported)   Not Taking at Unknown time  . loratadine (CLARITIN REDITABS) 10 MG dissolvable tablet Take 10 mg by mouth daily.   (Patient not taking: No sig reported)   Not Taking at Unknown time  . sulfamethoxazole-trimethoprim (BACTRIM,SEPTRA) 200-40 MG/5ML suspension Take by mouth 2 (two) times daily.   (Patient not taking: No sig reported)   Not Taking at Unknown time  . SUMAtriptan (IMITREX) 50 MG tablet Take 50 mg by mouth every 2 (two) hours as needed.   (Patient not taking: No sig reported)   Not Taking at Unknown time   Allergies  Allergen Reactions  . Amoxicillin Hives    Social History   Tobacco Use  . Smoking status: Never Smoker  . Smokeless tobacco: Never Used  Substance Use Topics  . Alcohol use: Yes    Comment: Occasional    No family history on file.   Review of Systems  Musculoskeletal: Positive for arthralgias.       Right hip  All other systems reviewed and are negative.   Objective:  Physical Exam Constitutional:      Appearance: Normal appearance.  HENT:     Head: Normocephalic and atraumatic.     Nose: Nose normal.     Mouth/Throat:     Pharynx: Oropharynx is clear.  Eyes:     Extraocular Movements: Extraocular movements intact.  Cardiovascular:     Rate and Rhythm: Normal rate.  Pulmonary:     Effort: Pulmonary effort is normal.  Abdominal:     Palpations: Abdomen is soft.  Musculoskeletal:     Cervical back: Normal range of motion.     Comments: Right hip motion is extremely painful.  Her leg lengths are roughly equal.  She walks with an altered gait.  Sensation and motor function are intact distally with palpable pulses in her feet.    Skin:    General: Skin is warm and dry.  Neurological:      General: No focal deficit present.     Mental Status: She is alert and oriented to person, place, and time.  Psychiatric:        Mood and Affect: Mood normal.        Behavior: Behavior normal.        Thought Content: Thought content normal.        Judgment: Judgment normal.     Vital signs in last 24 hours:  Labs:   Estimated body mass index is 38.74 kg/m as calculated from the following:   Height as of 02/05/21: 5\' 6"  (1.676 m).   Weight as of 02/05/21: 108.9 kg.   Imaging Review Plain radiographs demonstrate severe degenerative joint disease of the right hip(s). The bone quality appears to be good for age and reported activity level.      Assessment/Plan:  End stage primary arthritis, right hip(s)  The patient history, physical examination, clinical judgement of the provider and imaging studies are consistent with end stage degenerative joint disease of the right hip(s) and total hip arthroplasty is deemed medically necessary. The treatment options including medical management, injection therapy, arthroscopy and arthroplasty were discussed at length. The risks and benefits of total hip arthroplasty were presented and reviewed. The risks due to aseptic loosening, infection, stiffness, dislocation/subluxation,  thromboembolic complications and other imponderables were discussed.  The patient acknowledged the explanation, agreed to proceed with the plan and consent was signed. Patient is being admitted for inpatient treatment for surgery, pain control, PT, OT, prophylactic antibiotics, VTE prophylaxis, progressive ambulation and ADL's and discharge planning.The patient is planning to be discharged home with home health services

## 2021-02-11 NOTE — Progress Notes (Signed)
Pt aware to arrive at [facility] admitting at 0715 on Tuesday 02/12/2021 for scheduled surgical procedure. No food after midnight; clear liquids from midnight till 0700 consuming entire presurgical drink by 0700 then nothing by mouth.

## 2021-02-12 ENCOUNTER — Ambulatory Visit (HOSPITAL_COMMUNITY): Payer: 59

## 2021-02-12 ENCOUNTER — Ambulatory Visit (HOSPITAL_COMMUNITY): Payer: 59 | Admitting: Certified Registered Nurse Anesthetist

## 2021-02-12 ENCOUNTER — Encounter (HOSPITAL_COMMUNITY): Payer: Self-pay | Admitting: Orthopaedic Surgery

## 2021-02-12 ENCOUNTER — Encounter (HOSPITAL_COMMUNITY)
Admission: RE | Disposition: A | Discharge status: Home / Self Care | Payer: Self-pay | Source: Ambulatory Visit | Attending: Orthopaedic Surgery

## 2021-02-12 ENCOUNTER — Other Ambulatory Visit: Payer: Self-pay

## 2021-02-12 ENCOUNTER — Observation Stay (HOSPITAL_COMMUNITY)
Admission: RE | Admit: 2021-02-12 | Discharge: 2021-02-13 | Disposition: A | Discharge status: Home / Self Care | Payer: 59 | Source: Ambulatory Visit | Attending: Orthopaedic Surgery | Admitting: Orthopaedic Surgery

## 2021-02-12 DIAGNOSIS — M1611 Unilateral primary osteoarthritis, right hip: Secondary | ICD-10-CM | POA: Diagnosis present

## 2021-02-12 DIAGNOSIS — Z7982 Long term (current) use of aspirin: Secondary | ICD-10-CM | POA: Insufficient documentation

## 2021-02-12 DIAGNOSIS — Z419 Encounter for procedure for purposes other than remedying health state, unspecified: Secondary | ICD-10-CM

## 2021-02-12 HISTORY — PX: TOTAL HIP ARTHROPLASTY: SHX124

## 2021-02-12 LAB — ABO/RH: ABO/RH(D): O POS

## 2021-02-12 LAB — PREPARE RBC (CROSSMATCH)

## 2021-02-12 SURGERY — ARTHROPLASTY, HIP, TOTAL, ANTERIOR APPROACH
Anesthesia: Spinal | Site: Hip | Laterality: Right

## 2021-02-12 MED ORDER — VANCOMYCIN HCL 1000 MG/200ML IV SOLN
1000.0000 mg | Freq: Two times a day (BID) | INTRAVENOUS | Status: AC
  Administered 2021-02-12: 1000 mg via INTRAVENOUS
  Filled 2021-02-12: qty 200

## 2021-02-12 MED ORDER — PHENOL 1.4 % MT LIQD: 1.0000 | OROMUCOSAL | Status: DC | PRN

## 2021-02-12 MED ORDER — 0.9 % SODIUM CHLORIDE (POUR BTL) OPTIME
TOPICAL | Status: DC | PRN
  Administered 2021-02-12: 1000 mL

## 2021-02-12 MED ORDER — ONDANSETRON HCL 4 MG/2ML IJ SOLN
4.0000 mg | Freq: Four times a day (QID) | INTRAMUSCULAR | Status: DC | PRN
  Administered 2021-02-13: 4 mg via INTRAVENOUS
  Filled 2021-02-12: qty 2

## 2021-02-12 MED ORDER — MORPHINE SULFATE (PF) 2 MG/ML IV SOLN: 0.5000 mg | INTRAVENOUS | Status: DC | PRN

## 2021-02-12 MED ORDER — DULOXETINE HCL 60 MG PO CPEP: 60.0000 mg | ORAL_CAPSULE | Freq: Every day | ORAL | Status: DC

## 2021-02-12 MED ORDER — ONDANSETRON HCL 4 MG PO TABS: 4.0000 mg | ORAL_TABLET | Freq: Four times a day (QID) | ORAL | Status: DC | PRN

## 2021-02-12 MED ORDER — METHOCARBAMOL 500 MG PO TABS
500.0000 mg | ORAL_TABLET | Freq: Four times a day (QID) | ORAL | Status: DC | PRN
  Administered 2021-02-12: 500 mg via ORAL
  Filled 2021-02-12: qty 1

## 2021-02-12 MED ORDER — PHENYLEPHRINE 40 MCG/ML (10ML) SYRINGE FOR IV PUSH (FOR BLOOD PRESSURE SUPPORT)
PREFILLED_SYRINGE | INTRAVENOUS | Status: DC | PRN
  Administered 2021-02-12 (×4): 80 ug via INTRAVENOUS

## 2021-02-12 MED ORDER — BISACODYL 5 MG PO TBEC: 5.0000 mg | DELAYED_RELEASE_TABLET | Freq: Every day | ORAL | Status: DC | PRN

## 2021-02-12 MED ORDER — ONDANSETRON HCL 4 MG/2ML IJ SOLN
INTRAMUSCULAR | Status: AC
  Filled 2021-02-12: qty 2

## 2021-02-12 MED ORDER — PHENYLEPHRINE HCL-NACL 10-0.9 MG/250ML-% IV SOLN
INTRAVENOUS | Status: DC | PRN
  Administered 2021-02-12: 40 ug/min via INTRAVENOUS

## 2021-02-12 MED ORDER — ORAL CARE MOUTH RINSE: 15.0000 mL | Freq: Once | OROMUCOSAL | Status: AC

## 2021-02-12 MED ORDER — PANTOPRAZOLE SODIUM 40 MG PO TBEC
80.0000 mg | DELAYED_RELEASE_TABLET | Freq: Every day | ORAL | Status: DC
  Administered 2021-02-13: 80 mg via ORAL
  Filled 2021-02-12: qty 2

## 2021-02-12 MED ORDER — ACETAMINOPHEN 500 MG PO TABS
500.0000 mg | ORAL_TABLET | Freq: Four times a day (QID) | ORAL | Status: AC
Start: 2021-02-12 — End: 2021-02-13
  Administered 2021-02-12 (×3): 500 mg via ORAL
  Filled 2021-02-12 (×3): qty 1

## 2021-02-12 MED ORDER — EPHEDRINE 5 MG/ML INJ
INTRAVENOUS | Status: AC
  Filled 2021-02-12: qty 10

## 2021-02-12 MED ORDER — TRANEXAMIC ACID-NACL 1000-0.7 MG/100ML-% IV SOLN
1000.0000 mg | INTRAVENOUS | Status: AC
  Administered 2021-02-12: 1000 mg via INTRAVENOUS
  Filled 2021-02-12: qty 100

## 2021-02-12 MED ORDER — POVIDONE-IODINE 10 % EX SWAB
2.0000 "application " | Freq: Once | CUTANEOUS | Status: AC
  Administered 2021-02-12: 2 via TOPICAL

## 2021-02-12 MED ORDER — KETOROLAC TROMETHAMINE 15 MG/ML IJ SOLN
15.0000 mg | Freq: Four times a day (QID) | INTRAMUSCULAR | Status: AC
  Administered 2021-02-12 – 2021-02-13 (×4): 15 mg via INTRAVENOUS
  Filled 2021-02-12 (×4): qty 1

## 2021-02-12 MED ORDER — LACTATED RINGERS IV SOLN: INTRAVENOUS | Status: DC

## 2021-02-12 MED ORDER — SODIUM CHLORIDE 0.9 % IV SOLN: 10.0000 mL/h | Freq: Once | INTRAVENOUS | Status: DC

## 2021-02-12 MED ORDER — PROPOFOL 1000 MG/100ML IV EMUL
INTRAVENOUS | Status: AC
  Filled 2021-02-12: qty 100

## 2021-02-12 MED ORDER — METHOCARBAMOL 1000 MG/10ML IJ SOLN
500.0000 mg | Freq: Four times a day (QID) | INTRAVENOUS | Status: DC | PRN
  Filled 2021-02-12: qty 5

## 2021-02-12 MED ORDER — MIDAZOLAM HCL 2 MG/2ML IJ SOLN
INTRAMUSCULAR | Status: AC
  Filled 2021-02-12: qty 2

## 2021-02-12 MED ORDER — SODIUM CHLORIDE (PF) 0.9 % IJ SOLN
INTRAMUSCULAR | Status: AC
  Filled 2021-02-12: qty 10

## 2021-02-12 MED ORDER — PROPOFOL 10 MG/ML IV BOLUS
INTRAVENOUS | Status: DC | PRN
  Administered 2021-02-12: 20 mg via INTRAVENOUS

## 2021-02-12 MED ORDER — MENTHOL 3 MG MT LOZG: 1.0000 | LOZENGE | OROMUCOSAL | Status: DC | PRN

## 2021-02-12 MED ORDER — FENTANYL CITRATE (PF) 100 MCG/2ML IJ SOLN
INTRAMUSCULAR | Status: AC
  Filled 2021-02-12: qty 2

## 2021-02-12 MED ORDER — DULOXETINE HCL 30 MG PO CPEP
30.0000 mg | ORAL_CAPSULE | Freq: Every day | ORAL | Status: DC
  Administered 2021-02-13: 30 mg via ORAL
  Filled 2021-02-12: qty 1

## 2021-02-12 MED ORDER — BUPIVACAINE LIPOSOME 1.3 % IJ SUSP
INTRAMUSCULAR | Status: DC | PRN
  Administered 2021-02-12: 10 mL

## 2021-02-12 MED ORDER — BUPIVACAINE-EPINEPHRINE (PF) 0.25% -1:200000 IJ SOLN
INTRAMUSCULAR | Status: AC
  Filled 2021-02-12: qty 30

## 2021-02-12 MED ORDER — HYDROMORPHONE HCL 1 MG/ML IJ SOLN: 0.2500 mg | INTRAMUSCULAR | Status: DC | PRN

## 2021-02-12 MED ORDER — DEXAMETHASONE SODIUM PHOSPHATE 10 MG/ML IJ SOLN
INTRAMUSCULAR | Status: DC | PRN
  Administered 2021-02-12: 5 mg via INTRAVENOUS

## 2021-02-12 MED ORDER — BUPIVACAINE-EPINEPHRINE (PF) 0.25% -1:200000 IJ SOLN
INTRAMUSCULAR | Status: DC | PRN
  Administered 2021-02-12: 30 mL

## 2021-02-12 MED ORDER — ALBUMIN HUMAN 5 % IV SOLN
INTRAVENOUS | Status: AC
  Filled 2021-02-12: qty 250

## 2021-02-12 MED ORDER — ASPIRIN 81 MG PO CHEW
81.0000 mg | CHEWABLE_TABLET | Freq: Two times a day (BID) | ORAL | Status: DC
Start: 2021-02-13 — End: 2021-02-13
  Administered 2021-02-13: 81 mg via ORAL
  Filled 2021-02-12: qty 1

## 2021-02-12 MED ORDER — LIDOCAINE 2% (20 MG/ML) 5 ML SYRINGE
INTRAMUSCULAR | Status: DC | PRN
  Administered 2021-02-12: 40 mg via INTRAVENOUS

## 2021-02-12 MED ORDER — ACETAMINOPHEN 325 MG PO TABS: 325.0000 mg | ORAL_TABLET | Freq: Four times a day (QID) | ORAL | Status: DC | PRN

## 2021-02-12 MED ORDER — HYDROCODONE-ACETAMINOPHEN 7.5-325 MG PO TABS
1.0000 | ORAL_TABLET | ORAL | Status: DC | PRN
  Administered 2021-02-12 – 2021-02-13 (×2): 2 via ORAL
  Filled 2021-02-12 (×2): qty 2

## 2021-02-12 MED ORDER — GABAPENTIN 300 MG PO CAPS: 300.0000 mg | ORAL_CAPSULE | Freq: Three times a day (TID) | ORAL | Status: DC | PRN

## 2021-02-12 MED ORDER — LORATADINE 10 MG PO TABS
10.0000 mg | ORAL_TABLET | Freq: Every day | ORAL | Status: DC
  Administered 2021-02-12: 10 mg via ORAL
  Filled 2021-02-12: qty 1

## 2021-02-12 MED ORDER — SPIRONOLACTONE 100 MG PO TABS
100.0000 mg | ORAL_TABLET | Freq: Every day | ORAL | Status: DC
  Administered 2021-02-12 – 2021-02-13 (×2): 100 mg via ORAL
  Filled 2021-02-12 (×2): qty 1

## 2021-02-12 MED ORDER — DEXAMETHASONE SODIUM PHOSPHATE 10 MG/ML IJ SOLN
INTRAMUSCULAR | Status: AC
  Filled 2021-02-12: qty 1

## 2021-02-12 MED ORDER — TOPIRAMATE 25 MG PO TABS
50.0000 mg | ORAL_TABLET | Freq: Two times a day (BID) | ORAL | Status: DC
  Administered 2021-02-12 – 2021-02-13 (×2): 50 mg via ORAL
  Filled 2021-02-12 (×2): qty 2

## 2021-02-12 MED ORDER — BUPIVACAINE IN DEXTROSE 0.75-8.25 % IT SOLN
INTRATHECAL | Status: DC | PRN
  Administered 2021-02-12: 1.5 mL via INTRATHECAL

## 2021-02-12 MED ORDER — METOCLOPRAMIDE HCL 5 MG PO TABS: 5.0000 mg | ORAL_TABLET | Freq: Three times a day (TID) | ORAL | Status: DC | PRN

## 2021-02-12 MED ORDER — CHLORHEXIDINE GLUCONATE 0.12 % MT SOLN
15.0000 mL | Freq: Once | OROMUCOSAL | Status: AC
  Administered 2021-02-12: 15 mL via OROMUCOSAL

## 2021-02-12 MED ORDER — METOCLOPRAMIDE HCL 5 MG/ML IJ SOLN
5.0000 mg | Freq: Three times a day (TID) | INTRAMUSCULAR | Status: DC | PRN
  Administered 2021-02-13: 10 mg via INTRAVENOUS
  Filled 2021-02-12: qty 2

## 2021-02-12 MED ORDER — MIDAZOLAM HCL 5 MG/5ML IJ SOLN
INTRAMUSCULAR | Status: DC | PRN
  Administered 2021-02-12: 2 mg via INTRAVENOUS

## 2021-02-12 MED ORDER — HYPROMELLOSE (GONIOSCOPIC) 2.5 % OP SOLN: 1.0000 [drp] | Freq: Three times a day (TID) | OPHTHALMIC | Status: DC | PRN

## 2021-02-12 MED ORDER — TRANEXAMIC ACID-NACL 1000-0.7 MG/100ML-% IV SOLN
1000.0000 mg | Freq: Once | INTRAVENOUS | Status: AC
  Administered 2021-02-12: 1000 mg via INTRAVENOUS
  Filled 2021-02-12: qty 100

## 2021-02-12 MED ORDER — ONDANSETRON HCL 4 MG/2ML IJ SOLN: 4.0000 mg | Freq: Once | INTRAMUSCULAR | Status: DC | PRN

## 2021-02-12 MED ORDER — LIDOCAINE 2% (20 MG/ML) 5 ML SYRINGE
INTRAMUSCULAR | Status: AC
  Filled 2021-02-12: qty 5

## 2021-02-12 MED ORDER — TRANEXAMIC ACID 1000 MG/10ML IV SOLN
INTRAVENOUS | Status: DC | PRN
  Administered 2021-02-12: 2000 mg via TOPICAL

## 2021-02-12 MED ORDER — ALUM & MAG HYDROXIDE-SIMETH 200-200-20 MG/5ML PO SUSP: 30.0000 mL | ORAL | Status: DC | PRN

## 2021-02-12 MED ORDER — VANCOMYCIN HCL IN DEXTROSE 1-5 GM/200ML-% IV SOLN
1000.0000 mg | INTRAVENOUS | Status: AC
  Administered 2021-02-12: 1000 mg via INTRAVENOUS
  Filled 2021-02-12: qty 200

## 2021-02-12 MED ORDER — PHENYLEPHRINE 40 MCG/ML (10ML) SYRINGE FOR IV PUSH (FOR BLOOD PRESSURE SUPPORT)
PREFILLED_SYRINGE | INTRAVENOUS | Status: AC
  Filled 2021-02-12: qty 10

## 2021-02-12 MED ORDER — POLYVINYL ALCOHOL 1.4 % OP SOLN
1.0000 [drp] | OPHTHALMIC | Status: DC | PRN
  Filled 2021-02-12: qty 15

## 2021-02-12 MED ORDER — ESTRADIOL 1 MG PO TABS
0.5000 mg | ORAL_TABLET | Freq: Every day | ORAL | Status: DC
  Administered 2021-02-12: 0.5 mg via ORAL
  Filled 2021-02-12 (×2): qty 0.5

## 2021-02-12 MED ORDER — ONDANSETRON HCL 4 MG/2ML IJ SOLN
INTRAMUSCULAR | Status: DC | PRN
  Administered 2021-02-12: 4 mg via INTRAVENOUS

## 2021-02-12 MED ORDER — PHENYLEPHRINE HCL (PRESSORS) 10 MG/ML IV SOLN
INTRAVENOUS | Status: AC
  Filled 2021-02-12: qty 1

## 2021-02-12 MED ORDER — PROPOFOL 500 MG/50ML IV EMUL
INTRAVENOUS | Status: DC | PRN
  Administered 2021-02-12: 75 ug/kg/min via INTRAVENOUS

## 2021-02-12 MED ORDER — EPHEDRINE SULFATE-NACL 50-0.9 MG/10ML-% IV SOSY
PREFILLED_SYRINGE | INTRAVENOUS | Status: DC | PRN
  Administered 2021-02-12: 5 mg via INTRAVENOUS

## 2021-02-12 MED ORDER — SUMATRIPTAN SUCCINATE 50 MG PO TABS
50.0000 mg | ORAL_TABLET | ORAL | Status: DC | PRN
  Administered 2021-02-13: 50 mg via ORAL
  Filled 2021-02-12 (×4): qty 1

## 2021-02-12 MED ORDER — DOCUSATE SODIUM 100 MG PO CAPS
100.0000 mg | ORAL_CAPSULE | Freq: Two times a day (BID) | ORAL | Status: DC
  Administered 2021-02-12 – 2021-02-13 (×2): 100 mg via ORAL
  Filled 2021-02-12 (×2): qty 1

## 2021-02-12 MED ORDER — DIPHENHYDRAMINE HCL 12.5 MG/5ML PO ELIX: 12.5000 mg | ORAL_SOLUTION | ORAL | Status: DC | PRN

## 2021-02-12 MED ORDER — ALBUMIN HUMAN 5 % IV SOLN: INTRAVENOUS | Status: DC | PRN

## 2021-02-12 MED ORDER — FENTANYL CITRATE (PF) 100 MCG/2ML IJ SOLN
INTRAMUSCULAR | Status: DC | PRN
  Administered 2021-02-12 (×3): 50 ug via INTRAVENOUS
  Administered 2021-02-12: 25 ug via INTRAVENOUS

## 2021-02-12 MED ORDER — PROPOFOL 500 MG/50ML IV EMUL
INTRAVENOUS | Status: AC
  Filled 2021-02-12: qty 50

## 2021-02-12 MED ORDER — HYDROCODONE-ACETAMINOPHEN 5-325 MG PO TABS
1.0000 | ORAL_TABLET | ORAL | Status: DC | PRN
  Administered 2021-02-12 (×2): 1 via ORAL
  Filled 2021-02-12 (×2): qty 1

## 2021-02-12 SURGICAL SUPPLY — 51 items
ACETAB CUP W/GRIPTION 54 (Plate) ×2 IMPLANT
BAG DECANTER FOR FLEXI CONT (MISCELLANEOUS) ×2 IMPLANT
BLADE SAW SGTL 18X1.27X75 (BLADE) ×2 IMPLANT
BOOTIES KNEE HIGH SLOAN (MISCELLANEOUS) ×2 IMPLANT
CELLS DAT CNTRL 66122 CELL SVR (MISCELLANEOUS) ×1 IMPLANT
COVER PERINEAL POST (MISCELLANEOUS) ×2 IMPLANT
COVER SURGICAL LIGHT HANDLE (MISCELLANEOUS) ×3 IMPLANT
COVER WAND RF STERILE (DRAPES) IMPLANT
CUP ACETAB W/GRIPTION 54 (Plate) ×1 IMPLANT
CUP ACETABULAR GRIPTON 100 52 (Orthopedic Implant) ×1 IMPLANT
DECANTER SPIKE VIAL GLASS SM (MISCELLANEOUS) ×1 IMPLANT
DRAPE IMP U-DRAPE 54X76 (DRAPES) ×2 IMPLANT
DRAPE ORTHO SPLIT 77X108 STRL (DRAPES)
DRAPE STERI IOBAN 125X83 (DRAPES) ×2 IMPLANT
DRAPE SURG ORHT 6 SPLT 77X108 (DRAPES) IMPLANT
DRAPE U-SHAPE 47X51 STRL (DRAPES) ×4 IMPLANT
DRSG AQUACEL AG ADV 3.5X 6 (GAUZE/BANDAGES/DRESSINGS) ×2 IMPLANT
DURAPREP 26ML APPLICATOR (WOUND CARE) ×2 IMPLANT
ELECT BLADE TIP CTD 4 INCH (ELECTRODE) ×2 IMPLANT
ELECT REM PT RETURN 15FT ADLT (MISCELLANEOUS) ×2 IMPLANT
ELIMINATOR HOLE APEX DEPUY (Hips) ×1 IMPLANT
GLOVE SRG 8 PF TXTR STRL LF DI (GLOVE) ×2 IMPLANT
GLOVE SURG ENC MOIS LTX SZ8 (GLOVE) ×4 IMPLANT
GLOVE SURG UNDER POLY LF SZ8 (GLOVE) ×4
GOWN STRL REUS W/TWL XL LVL3 (GOWN DISPOSABLE) ×4 IMPLANT
GRIPTON 100 52 (Orthopedic Implant) ×2 IMPLANT
HEAD M SROM 36MM 2 (Hips) IMPLANT
HOLDER FOLEY CATH W/STRAP (MISCELLANEOUS) ×2 IMPLANT
KIT TURNOVER KIT A (KITS) ×2 IMPLANT
LINER NEUTRAL 54X36MM PLUS 4 (Hips) ×1 IMPLANT
MANIFOLD NEPTUNE II (INSTRUMENTS) ×2 IMPLANT
NEEDLE HYPO 22GX1.5 SAFETY (NEEDLE) ×2 IMPLANT
NS IRRIG 1000ML POUR BTL (IV SOLUTION) ×2 IMPLANT
PACK ANTERIOR HIP CUSTOM (KITS) ×2 IMPLANT
PENCIL SMOKE EVACUATOR (MISCELLANEOUS) IMPLANT
PROTECTOR NERVE ULNAR (MISCELLANEOUS) ×2 IMPLANT
RETRACTOR WND ALEXIS 18 MED (MISCELLANEOUS) ×1 IMPLANT
RTRCTR WOUND ALEXIS 18CM MED (MISCELLANEOUS) ×2
SCREW 6.5MMX25MM (Screw) ×2 IMPLANT
SCREW PINN CAN BONE 6.5MMX15MM (Screw) ×1 IMPLANT
SROM M HEAD 36MM 2 (Hips) ×2 IMPLANT
STEM FEMORAL SZ5 HIGH ACTIS (Stem) ×1 IMPLANT
SUT ETHIBOND NAB CT1 #1 30IN (SUTURE) ×5 IMPLANT
SUT VIC AB 1 CT1 36 (SUTURE) ×2 IMPLANT
SUT VIC AB 2-0 CT1 27 (SUTURE) ×2
SUT VIC AB 2-0 CT1 TAPERPNT 27 (SUTURE) ×1 IMPLANT
SUT VICRYL AB 3-0 FS1 BRD 27IN (SUTURE) ×2 IMPLANT
SUT VLOC 180 0 24IN GS25 (SUTURE) ×2 IMPLANT
SYR 50ML LL SCALE MARK (SYRINGE) ×2 IMPLANT
TRAY FOLEY MTR SLVR 14FR STAT (SET/KITS/TRAYS/PACK) ×1 IMPLANT
TRAY FOLEY MTR SLVR 16FR STAT (SET/KITS/TRAYS/PACK) IMPLANT

## 2021-02-12 NOTE — Plan of Care (Signed)
  Problem: Education: Goal: Knowledge of General Education information will improve Description: Including pain rating scale, medication(s)/side effects and non-pharmacologic comfort measures Outcome: Progressing   Problem: Health Behavior/Discharge Planning: Goal: Ability to manage health-related needs will improve Outcome: Progressing   Problem: Activity: Goal: Risk for activity intolerance will decrease Outcome: Progressing   Problem: Elimination: Goal: Will not experience complications related to bowel motility Outcome: Progressing   Problem: Pain Managment: Goal: General experience of comfort will improve Outcome: Progressing   Problem: Education: Goal: Knowledge of the prescribed therapeutic regimen will improve Outcome: Progressing Goal: Understanding of discharge needs will improve Outcome: Progressing   Problem: Activity: Goal: Ability to avoid complications of mobility impairment will improve Outcome: Progressing Goal: Ability to tolerate increased activity will improve Outcome: Progressing   Problem: Pain Management: Goal: Pain level will decrease with appropriate interventions Outcome: Progressing

## 2021-02-12 NOTE — Op Note (Signed)
PRE-OP DIAGNOSIS:  RIGHT HIP DEGENERATIVE JOINT DISEASE POST-OP DIAGNOSIS:  same PROCEDURE: RIGHT TOTAL HIP ARTHROPLASTY ANTERIOR APPROACH ANESTHESIA:  Spinal and MAC SURGEON:  Melrose Nakayama MD ASSISTANT:  Loni Dolly PA-C   INDICATIONS FOR PROCEDURE:  The patient is a 55 y.o. female with a long history of a painful hip.  This has persisted despite multiple conservative measures.  The patient has persisted with pain and dysfunction making rest and activity difficult.  A total hip replacement is offered as surgical treatment.  Informed operative consent was obtained after discussion of possible complications including reaction to anesthesia, infection, neurovascular injury, dislocation, DVT, PE, and death.  The importance of the postoperative rehab program to optimize result was stressed with the patient.  SUMMARY OF FINDINGS AND PROCEDURE:  Under the above anesthesia through a anterior approach an the Hana table a right THR was performed.  The patient had severe degenerative change and poor bone quality.  We used DePuy components to replace the hip and these were size 5 high offset Actis femur capped with a -2 30m metal hip ball.  On the acetabular side we used a size 54 3-hole cup with a +4 polyethylene liner.  We did use a hole eliminator and filled all three holes in the cup with screws all of which obtained excellent purchase.  ALoni DollyPA-C assisted throughout and was invaluable to the completion of the case in that he helped position and retract while I performed the procedure.  He also closed simultaneously to help minimize OR time.  I used fluoroscopy throughout the case to check position of implants and leg lengths and read all of these views myself.  DESCRIPTION OF PROCEDURE:  The patient was taken to the OR suite where the above anesthetic was applied.  The patient was then positioned on the Hana table supine.  All bony prominences were appropriately padded.  Prep and drape was then  performed in normal sterile fashion.  The patient was given vancomycin preoperative antibiotic and an appropriate time out was performed.  We then took an anterior approach to the right hip.  Dissection was taken through adipose to the tensor fascia lata fascia.  This structure was incised longitudinally and we dissected in the intermuscular interval just medial to this muscle.  Cobra retractors were placed superior and inferior to the femoral neck superficial to the capsule.  A capsular incision was then made and the retractors were placed along the femoral neck.  Xray was brought in to get a good level for the femoral neck cut which was made with an oscillating saw and osteotome.  The femoral head was removed with a corkscrew.  The acetabulum was exposed and some labral tissues were excised. Reaming was taken to the inside wall of the pelvis and sequentially up to 1 mm smaller than the actual component.  A trial of components was done and then the aforementioned acetabular shell was placed in appropriate tilt and anteversion confirmed by fluoroscopy. The liner was placed along with the hole eliminator and attention was turned to the femur.  The leg was brought down and over into adduction and the elevator bar was used to raise the femur up gently in the wound.  The piriformis was released with care taken to preserve the obturator internus attachment and all of the posterior capsule. The femur was reamed and then broached to the appropriate size.  A trial reduction was done and the aforementioned head and neck assembly gave u[place]oreathe best  stability in extension with external rotation.  Leg lengths were felt to be about equal by fluoroscopic exam.  The trial components were removed and the wound irrigated.  We then placed the femoral component in appropriate anteversion.  The head was applied to a dry stem neck and the hip again reduced.  It was again stable in the aforementioned position.  The would was irrigated  again followed by re-approximation of anterior capsule with ethibond suture. Tensor fascia was repaired with V-loc suture  followed by deep closure with #O and #2 undyed vicryl.  Skin was closed with subQ stitch and steristrips followed by a sterile dressing.  EBL and IOF can be obtained from anesthesia records.  DISPOSITION:  The patient was extubated in the OR and taken to PACU in stable condition to be admitted to the Orthopedic Surgery for appropriate post-op care to include perioperative antibiotics and DVT prophylaxis.

## 2021-02-12 NOTE — Transfer of Care (Signed)
Immediate Anesthesia Transfer of Care Note  Patient: Rebecca Vargas  Procedure(s) Performed: TOTAL HIP ARTHROPLASTY ANTERIOR APPROACH (Right Hip)  Patient Location: PACU  Anesthesia Type:Spinal  Level of Consciousness: awake, alert , oriented and patient cooperative  Airway & Oxygen Therapy: Patient Spontanous Breathing and Patient connected to face mask  Post-op Assessment: Report given to RN and Post -op Vital signs reviewed and stable  Post vital signs: Reviewed and stable  Last Vitals:  Vitals Value Taken Time  BP 135/94 02/12/21 1230  Temp 36.6 C 02/12/21 1226  Pulse 98 02/12/21 1230  Resp 17 02/12/21 1230  SpO2 100 % 02/12/21 1230  Vitals shown include unvalidated device data.  Last Pain:  Vitals:   02/12/21 0812  TempSrc: Oral  PainSc:       Patients Stated Pain Goal: 2 (02/12/21 0739)  Complications: No complications documented.

## 2021-02-12 NOTE — Anesthesia Preprocedure Evaluation (Signed)
Anesthesia Evaluation  Patient identified by MRN, date of birth, ID band Patient awake    Reviewed: Allergy & Precautions, H&P , NPO status , Patient's Chart, lab work & pertinent test results  Airway Mallampati: II  TM Distance: <3 FB Neck ROM: Full    Dental no notable dental hx.    Pulmonary neg pulmonary ROS,    Pulmonary exam normal breath sounds clear to auscultation + decreased breath sounds      Cardiovascular negative cardio ROS Normal cardiovascular exam Rhythm:Regular Rate:Normal     Neuro/Psych negative neurological ROS  negative psych ROS   GI/Hepatic Neg liver ROS, GERD  Medicated,  Endo/Other  Morbid obesity  Renal/GU negative Renal ROS  negative genitourinary   Musculoskeletal negative musculoskeletal ROS (+)   Abdominal   Peds negative pediatric ROS (+)  Hematology negative hematology ROS (+)   Anesthesia Other Findings   Reproductive/Obstetrics negative OB ROS                             Anesthesia Physical Anesthesia Plan  ASA: II  Anesthesia Plan: Spinal   Post-op Pain Management:    Induction: Intravenous  PONV Risk Score and Plan: 2 and Ondansetron and Dexamethasone  Airway Management Planned: Simple Face Mask  Additional Equipment:   Intra-op Plan:   Post-operative Plan:   Informed Consent: I have reviewed the patients History and Physical, chart, labs and discussed the procedure including the risks, benefits and alternatives for the proposed anesthesia with the patient or authorized representative who has indicated his/her understanding and acceptance.     Dental advisory given  Plan Discussed with: CRNA and Surgeon  Anesthesia Plan Comments:         Anesthesia Quick Evaluation

## 2021-02-12 NOTE — Interval H&P Note (Signed)
History and Physical Interval Note:  02/12/2021 8:44 AM  Rebecca Vargas  has presented today for surgery, with the diagnosis of RIGHT HIP DEGENERATIVE JOINT DISEASE.  The various methods of treatment have been discussed with the patient and family. After consideration of risks, benefits and other options for treatment, the patient has consented to  Procedure(s): TOTAL HIP ARTHROPLASTY ANTERIOR APPROACH (Right) as a surgical intervention.  The patient's history has been reviewed, patient examined, no change in status, stable for surgery.  I have reviewed the patient's chart and labs.  Questions were answered to the patient's satisfaction.     Velna Ochs

## 2021-02-12 NOTE — Evaluation (Signed)
Physical Therapy Evaluation Patient Details Name: Rebecca Vargas MRN: [id number] DOB: 10/11/1966 Today's Date: 02/12/2021   History of Present Illness  patient is a 55 y.o. female s/p Rt THA on 02/12/2021 with PMH significant for migranes, GERD, dyslipidemia, OA, anxiety, and anemia.  Clinical Impression  Pt is a 54y.o. female s/p Rt THA POD 0. Pt reports that she is modified independent with use of cane for mobility at baseline. Pt required MIN guard and verbal cues for sit to stand transfers. Pt required MIN assist for safety for ambulation 13ft with verbal cues for RW management and step to gait pattern with no LOB. PT reviewed therapeutic intervention for promotion of DVT prevention, pt demonstrated understanding. Pt will have assistance from her husband and other family members upon discharge. Pt will benefit from skilled PT to increase independence and safety with mobility. Acute therapy to follow up during stay to progress functional mobility as able to ensure safe dischare home.      Follow Up Recommendations Home health PT;Follow surgeon's recommendation for DC plan and follow-up therapies    Equipment Recommendations  Rolling walker with 5" wheels    Recommendations for Other Services       Precautions / Restrictions Precautions Precautions: Fall Restrictions Weight Bearing Restrictions: No Other Position/Activity Restrictions: WBAT      Mobility  Bed Mobility Overal bed mobility: Needs Assistance Bed Mobility: Supine to Sit     Supine to sit: Supervision;HOB elevated     General bed mobility comments: supervision for safety with use of B UEs to scoot to EOB.    Transfers Overall transfer level: Needs assistance Equipment used: Rolling walker (2 wheeled) Transfers: Sit to/from Stand Sit to Stand: Min guard         General transfer comment: MIN guard for safety with cues for safe hand placement. Pt became dizzy in standing which resolved after  ~28min.  Ambulation/Gait Ambulation/Gait assistance: Min assist Gait Distance (Feet): 25 Feet Assistive device: Rolling walker (2 wheeled) Gait Pattern/deviations: Step-to pattern;Decreased stride length;Decreased weight shift to right     General Gait Details: Pt performed pre gait marching with MIN assist for safety and use of B UEs on RW with no LOB. MIN assist with cues for step to gait pattern and to maintain safe proximity to RW.  Stairs            Wheelchair Mobility    Modified Rankin (Stroke Patients Only)       Balance Overall balance assessment: Needs assistance Sitting-balance support: Feet supported Sitting balance-Leahy Scale: Good     Standing balance support: Bilateral upper extremity supported;During functional activity Standing balance-Leahy Scale: Poor Standing balance comment: use of RW to maintan standing balance                             Pertinent Vitals/Pain Pain Assessment: 0-10 Pain Score: 6  Pain Location: Rt hip/buttock region Pain Descriptors / Indicators: Sore;Discomfort Pain Intervention(s): Limited activity within patient's tolerance;Monitored during session;Repositioned    Home Living Family/patient expects to be discharged to:: Private residence Living Arrangements: Spouse/significant other Available Help at Discharge: Family Type of Home: House Home Access: Stairs to enter Entrance Stairs-Rails: None Entrance Stairs-Number of Steps: small threshold/curb at door Home Layout: One level Home Equipment: Cane - quad;Shower seat Additional Comments: pt lives with her husband at home who is caregiver for his mother. Pt reports that other family members will be able to provide  assistance at home when needed.    Prior Function Level of Independence: Independent with assistive device(s)         Comments: use of cane     Hand Dominance        Extremity/Trunk Assessment   Upper Extremity Assessment Upper  Extremity Assessment: Overall WFL for tasks assessed    Lower Extremity Assessment Lower Extremity Assessment: RLE deficits/detail RLE Deficits / Details: Pt with good Rt quad set strength, DF Rt 3+/5, DF Lt 4/5, and 4+/5 B plantar flexion strength. RLE Sensation: WNL RLE Coordination: WNL    Cervical / Trunk Assessment Cervical / Trunk Assessment: Normal  Communication   Communication: No difficulties  Cognition Arousal/Alertness: Awake/alert Behavior During Therapy: WFL for tasks assessed/performed Overall Cognitive Status: Within Functional Limits for tasks assessed                                        General Comments      Exercises Total Joint Exercises Ankle Circles/Pumps: AROM;Both;10 reps;Seated   Assessment/Plan    PT Assessment Patient needs continued PT services  PT Problem List Decreased strength;Decreased range of motion;Decreased activity tolerance;Decreased balance;Decreased mobility;Decreased knowledge of use of DME;Pain       PT Treatment Interventions DME instruction;Gait training;Stair training;Functional mobility training;Therapeutic activities;Therapeutic exercise;Balance training;Patient/family education    PT Goals (Current goals can be found in the Care Plan section)  Acute Rehab PT Goals Patient Stated Goal: none stated PT Goal Formulation: With patient Time For Goal Achievement: 02/19/21 Potential to Achieve Goals: Good    Frequency 7X/week   Barriers to discharge        Co-evaluation               AM-PAC PT "6 Clicks" Mobility  Outcome Measure Help needed turning from your back to your side while in a flat bed without using bedrails?: None Help needed moving from lying on your back to sitting on the side of a flat bed without using bedrails?: None Help needed moving to and from a bed to a chair (including a wheelchair)?: A Little Help needed standing up from a chair using your arms (e.g., wheelchair or bedside  chair)?: A Little Help needed to walk in hospital room?: A Little Help needed climbing 3-5 steps with a railing? : A Lot 6 Click Score: 19    End of Session Equipment Utilized During Treatment: Gait belt Activity Tolerance: Patient tolerated treatment well Patient left: in chair;with call bell/phone within reach;with chair alarm set Nurse Communication: Mobility status PT Visit Diagnosis: Unsteadiness on feet (R26.81);Muscle weakness (generalized) (M62.81);Pain Pain - Right/Left: Right Pain - part of body: Hip    Time: 6789-3810 PT Time Calculation (min) (ACUTE ONLY): 21 min   Charges:              Loyal Gambler, SPT  Acute rehab    Rebecca Vargas 02/12/2021, 6:00 PM

## 2021-02-12 NOTE — Anesthesia Postprocedure Evaluation (Signed)
Anesthesia Post Note  Patient: Rebecca Vargas  Procedure(s) Performed: TOTAL HIP ARTHROPLASTY ANTERIOR APPROACH (Right Hip)     Patient location during evaluation: PACU Anesthesia Type: Spinal Level of consciousness: oriented and awake and alert Pain management: pain level controlled Vital Signs Assessment: post-procedure vital signs reviewed and stable Respiratory status: spontaneous breathing, respiratory function stable and patient connected to nasal cannula oxygen Cardiovascular status: blood pressure returned to baseline and stable Postop Assessment: no headache, no backache and no apparent nausea or vomiting Anesthetic complications: no   No complications documented.  Last Vitals:  Vitals:   02/12/21 1434 02/12/21 1542  BP: 119/86 122/83  Pulse: 96 91  Resp: 20 20  Temp: (!) 36.4 C 36.9 C  SpO2: 100% 100%    Last Pain:  Vitals:   02/12/21 1542  TempSrc: Oral  PainSc:                  Rebecca Vargas

## 2021-02-12 NOTE — Anesthesia Procedure Notes (Signed)
Spinal  Patient location during procedure: OR Start time: 02/12/2021 9:38 AM End time: 02/12/2021 9:42 AM Staffing Performed: anesthesiologist  Anesthesiologist: Myrtie Soman, MD Preanesthetic Checklist Completed: patient identified, IV checked, site marked, risks and benefits discussed, surgical consent, monitors and equipment checked, pre-op evaluation and timeout performed Spinal Block Patient position: sitting Prep: Betadine Patient monitoring: heart rate, continuous pulse ox and blood pressure Approach: midline Location: L3-4 Injection technique: single-shot Needle Needle type: Sprotte  Needle gauge: 24 G Needle length: 9 cm Assessment Sensory level: T6 Additional Notes Expiration date of kit checked and confirmed. Patient tolerated procedure well, without complications.

## 2021-02-13 ENCOUNTER — Encounter (HOSPITAL_COMMUNITY): Payer: Self-pay | Admitting: Orthopaedic Surgery

## 2021-02-13 DIAGNOSIS — M1611 Unilateral primary osteoarthritis, right hip: Secondary | ICD-10-CM | POA: Diagnosis not present

## 2021-02-13 LAB — BPAM RBC
Blood Product Expiration Date: 202204022359
Blood Product Expiration Date: 202204022359
ISSUE DATE / TIME: 202203081211
ISSUE DATE / TIME: 202203081557
Unit Type and Rh: 5100
Unit Type and Rh: 5100

## 2021-02-13 LAB — POCT I-STAT EG7
Acid-base deficit: 2 mmol/L (ref 0.0–2.0)
Acid-base deficit: 4 mmol/L — ABNORMAL HIGH (ref 0.0–2.0)
Bicarbonate: 25.1 mmol/L (ref 20.0–28.0)
Bicarbonate: 23.2 mmol/L (ref 20.0–28.0)
Calcium, Ion: 1.36 mmol/L (ref 1.15–1.40)
Calcium, Ion: 1.34 mmol/L (ref 1.15–1.40)
HCT: 33 % — ABNORMAL LOW (ref 36.0–46.0)
HCT: 28 % — ABNORMAL LOW (ref 36.0–46.0)
Hemoglobin: 11.2 g/dL — ABNORMAL LOW (ref 12.0–15.0)
Hemoglobin: 9.5 g/dL — ABNORMAL LOW (ref 12.0–15.0)
O2 Saturation: 81 %
O2 Saturation: 91 %
Patient temperature: 37
Patient temperature: 37
Potassium: 3.9 mmol/L (ref 3.5–5.1)
Potassium: 4 mmol/L (ref 3.5–5.1)
Sodium: 142 mmol/L (ref 135–145)
Sodium: 141 mmol/L (ref 135–145)
TCO2: 27 mmol/L (ref 22–32)
TCO2: 25 mmol/L (ref 22–32)
pCO2, Ven: 54.6 mmHg (ref 44.0–60.0)
pCO2, Ven: 51.9 mmHg (ref 44.0–60.0)
pH, Ven: 7.27 (ref 7.250–7.430)
pH, Ven: 7.257 (ref 7.250–7.430)
pO2, Ven: 53 mmHg — ABNORMAL HIGH (ref 32.0–45.0)
pO2, Ven: 71 mmHg — ABNORMAL HIGH (ref 32.0–45.0)

## 2021-02-13 LAB — BASIC METABOLIC PANEL
Anion gap: 10 (ref 5–15)
BUN: 11 mg/dL (ref 6–20)
CO2: 21 mmol/L — ABNORMAL LOW (ref 22–32)
Calcium: 8.6 mg/dL — ABNORMAL LOW (ref 8.9–10.3)
Chloride: 105 mmol/L (ref 98–111)
Creatinine, Ser: 0.57 mg/dL (ref 0.44–1.00)
GFR, Estimated: >60 mL/min (ref >60)
Glucose, Bld: 123 mg/dL — ABNORMAL HIGH (ref 70–99)
Potassium: 3.6 mmol/L (ref 3.5–5.1)
Sodium: 136 mmol/L (ref 135–145)

## 2021-02-13 LAB — CBC
HCT: 28.3 % — ABNORMAL LOW (ref 36.0–46.0)
Hemoglobin: 9.7 g/dL — ABNORMAL LOW (ref 12.0–15.0)
MCH: 31.8 pg (ref 26.0–34.0)
MCHC: 34.3 g/dL (ref 30.0–36.0)
MCV: 92.8 fL (ref 80.0–100.0)
Platelets: 163 10*3/uL (ref 150–400)
RBC: 3.05 MIL/uL — ABNORMAL LOW (ref 3.87–5.11)
RDW: 13.4 % (ref 11.5–15.5)
WBC: 10.5 10*3/uL (ref 4.0–10.5)
nRBC: 0 % (ref 0.0–0.2)

## 2021-02-13 LAB — TYPE AND SCREEN
ABO/RH(D): O POS
Antibody Screen: NEGATIVE
Unit division: 0
Unit division: 0

## 2021-02-13 MED ORDER — ASPIRIN 81 MG PO CHEW: 81.0000 mg | CHEWABLE_TABLET | Freq: Two times a day (BID) | ORAL | 0 refills | Status: DC

## 2021-02-13 MED ORDER — TIZANIDINE HCL 4 MG PO TABS: 4.0000 mg | ORAL_TABLET | Freq: Four times a day (QID) | ORAL | 0 refills | Status: DC | PRN

## 2021-02-13 MED ORDER — HYDROCODONE-ACETAMINOPHEN 5-325 MG PO TABS: 1.0000 | ORAL_TABLET | Freq: Four times a day (QID) | ORAL | 0 refills | Status: DC | PRN

## 2021-02-13 NOTE — Progress Notes (Signed)
Physical Therapy Treatment Patient Details Name: Rebecca Vargas MRN: [id number] DOB: Nov 27, 1966 Today's Date: 02/13/2021    History of Present Illness patient is a 55 y.o. female s/p Rt THA on 02/12/2021 with PMH significant for migranes, GERD, dyslipidemia, OA, anxiety, and anemia.    PT Comments    Pt has progressed well with mobility, she ambulated 125' with RW, no loss of balance. Stair training completed. She demonstrates good understanding of HEP. She is ready to DC home from PT standpoint.   Follow Up Recommendations  Follow surgeon's recommendation for DC plan and follow-up therapies     Equipment Recommendations  Rolling walker with 5" wheels    Recommendations for Other Services       Precautions / Restrictions Precautions Precautions: Fall Restrictions Weight Bearing Restrictions: No Other Position/Activity Restrictions: WBAT    Mobility  Bed Mobility Overal bed mobility: Modified Independent Bed Mobility: Supine to Sit     Supine to sit: Modified independent (Device/Increase time)     General bed mobility comments: used rail to raise trunk    Transfers Overall transfer level: Needs assistance Equipment used: Rolling walker (2 wheeled) Transfers: Sit to/from Stand Sit to Stand: Min guard         General transfer comment: VCs hand placement  Ambulation/Gait Ambulation/Gait assistance: Min guard Gait Distance (Feet): 125 Feet Assistive device: Rolling walker (2 wheeled) Gait Pattern/deviations: Step-to pattern;Decreased stride length;Decreased weight shift to right Gait velocity: decr   General Gait Details: steady with RW, no loss of balance, good sequencing   Stairs Stairs: Yes Stairs assistance: Min guard Stair Management: No rails;Backwards;With walker Number of Stairs: 1 General stair comments: VCs sequencing, min/guard safety   Wheelchair Mobility    Modified Rankin (Stroke Patients Only)       Balance Overall balance  assessment: Needs assistance Sitting-balance support: Feet supported Sitting balance-Leahy Scale: Good     Standing balance support: Bilateral upper extremity supported;During functional activity Standing balance-Leahy Scale: Poor Standing balance comment: use of RW to maintan standing balance                            Cognition Arousal/Alertness: Awake/alert Behavior During Therapy: WFL for tasks assessed/performed Overall Cognitive Status: Within Functional Limits for tasks assessed                                        Exercises Total Joint Exercises Ankle Circles/Pumps: AROM;Both;10 reps;Seated Quad Sets: AROM;Both;10 reps;Supine Short Arc Quad: AROM;Right;10 reps;Supine Heel Slides: AAROM;Right;10 reps;Supine Hip ABduction/ADduction: AAROM;Right;10 reps;Supine Long Arc Quad: AROM;Right;10 reps;Seated    General Comments        Pertinent Vitals/Pain Pain Score: 5  Pain Location: Rt hip/buttock region Pain Descriptors / Indicators: Sore;Discomfort Pain Intervention(s): Limited activity within patient's tolerance;Monitored during session;Premedicated before session;Ice applied    Home Living                      Prior Function            PT Goals (current goals can now be found in the care plan section) Acute Rehab PT Goals Patient Stated Goal: none stated PT Goal Formulation: With patient Time For Goal Achievement: 02/19/21 Potential to Achieve Goals: Good Progress towards PT goals: Goals met/education completed, patient discharged from PT    Frequency    7X/week  PT Plan Current plan remains appropriate    Co-evaluation              AM-PAC PT "6 Clicks" Mobility   Outcome Measure  Help needed turning from your back to your side while in a flat bed without using bedrails?: None Help needed moving from lying on your back to sitting on the side of a flat bed without using bedrails?: None Help needed  moving to and from a bed to a chair (including a wheelchair)?: None Help needed standing up from a chair using your arms (e.g., wheelchair or bedside chair)?: None Help needed to walk in hospital room?: None Help needed climbing 3-5 steps with a railing? : A Little 6 Click Score: 23    End of Session Equipment Utilized During Treatment: Gait belt Activity Tolerance: Patient tolerated treatment well Patient left: in chair;with call bell/phone within reach;with chair alarm set Nurse Communication: Mobility status PT Visit Diagnosis: Unsteadiness on feet (R26.81);Muscle weakness (generalized) (M62.81);Pain Pain - Right/Left: Right Pain - part of body: Hip     Time: 7543-6067 PT Time Calculation (min) (ACUTE ONLY): 35 min  Charges:  $Gait Training: 8-22 mins $Therapeutic Exercise: 8-22 mins                     Blondell Reveal Kistler PT 02/13/2021  Acute Rehabilitation Services Pager [phone] Office [phone]

## 2021-02-13 NOTE — Discharge Summary (Signed)
Patient ID: Rebecca Vargas MRN: [id number] DOB/AGE: 55-Apr-1967 55 y.o.  Admit date: 02/12/2021 Discharge date: 02/13/2021  Admission Diagnoses:  Active Problems:   Primary osteoarthritis of right hip   Discharge Diagnoses:  Same  Past Medical History:  Diagnosis Date  . Acne   . Anemia   . Anxiety   . Arthritis   . Dyslipidemia   . Fibrocystic breast disease in female   . GERD (gastroesophageal reflux disease)   . Migraine   . Multinodular goiter    Pt denies  . PMDD (premenstrual dysphoric disorder)   . Sleep apnea     Surgeries: Procedure(s): TOTAL HIP ARTHROPLASTY ANTERIOR APPROACH on 02/12/2021   Consultants:   Discharged Condition: Improved  Hospital Course: Rebecca Vargas is an 55 y.o. female who was admitted 02/12/2021 for operative treatment of<principal problem not specified>. Patient has severe unremitting pain that affects sleep, daily activities, and work/hobbies. After pre-op clearance the patient was taken to the operating room on 02/12/2021 and underwent  Procedure(s): TOTAL HIP ARTHROPLASTY ANTERIOR APPROACH.    Patient was given perioperative antibiotics:  Anti-infectives (From admission, onward)   Start     Dose/Rate Route Frequency Ordered Stop   02/12/21 2000  vancomycin (VANCOREADY) IVPB 1000 mg/200 mL        1,000 mg 200 mL/hr over 60 Minutes Intravenous Every 12 hours 02/12/21 1340 02/13/21 0801   02/12/21 0730  vancomycin (VANCOCIN) IVPB 1000 mg/200 mL premix        1,000 mg 200 mL/hr over 60 Minutes Intravenous On call to O.R. 02/12/21 [id number] 02/13/21 0801       Patient was given sequential compression devices, early ambulation, and chemoprophylaxis to prevent DVT.  Patient had some blood loss during surgery and was given a unit of blood in the OR. She tolerated this well.  Patient benefited maximally from hospital stay and there were no complications.    Recent vital signs:  Patient Vitals for the past 24 hrs:  BP Temp Temp src Pulse Resp SpO2   02/13/21 0500 124/75 99 F (37.2 C) Oral 95 16 96 %  02/13/21 0312 119/78 98.1 F (36.7 C) Axillary 90 20 96 %  02/13/21 0100 115/72 97.7 F (36.5 C) Oral 87 16 92 %  02/12/21 2142 116/71 98.2 F (36.8 C) Oral 88 16 95 %  02/12/21 1901 117/83 - - 93 18 97 %  02/12/21 1624 135/88 97.8 F (36.6 C) Oral 93 20 99 %  02/12/21 1542 122/83 98.4 F (36.9 C) Oral 91 20 100 %  02/12/21 1434 119/86 (!) 97.5 F (36.4 C) Oral 96 20 100 %  02/12/21 1343 (!) 138/94 98.1 F (36.7 C) Oral 99 20 100 %  02/12/21 1315 118/90 98 F (36.7 C) - 96 13 100 %  02/12/21 1300 133/69 - - 97 14 100 %  02/12/21 1245 128/84 - - 99 17 100 %  02/12/21 1230 (!) 135/94 - - 96 18 100 %  02/12/21 1226 123/87 97.8 F (36.6 C) - 99 17 99 %  02/12/21 0812 124/79 98.4 F (36.9 C) Oral 82 16 99 %     Recent laboratory studies:  Recent Labs    02/12/21 1200 02/13/21 0320  WBC  --  10.5  HGB 9.5* 9.7*  HCT 28.0* 28.3*  PLT  --  163  NA 141 136  K 4.0 3.6  CL  --  105  CO2  --  21*  BUN  --  11  CREATININE  --  0.57  GLUCOSE  --  123*  CALCIUM  --  8.6*     Discharge Medications:   Allergies as of 02/13/2021      Reactions   Amoxicillin Hives      Medication List    STOP taking these medications   ibuprofen 200 MG tablet Commonly known as: ADVIL   meloxicam 15 MG tablet Commonly known as: MOBIC     TAKE these medications   ALPRAZolam 0.5 MG tablet Commonly known as: Xanax Take 1 tablet (0.5 mg total) by mouth at bedtime as needed for sleep.   aspirin 81 MG chewable tablet Chew 1 tablet (81 mg total) by mouth 2 (two) times daily.   baclofen 10 MG tablet Commonly known as: LIORESAL 1/2 - 1 tablet QHS or up to 3x/day for acute spasm   cetirizine 10 MG tablet Commonly known as: ZYRTEC Take 10 mg by mouth daily.   citalopram 10 MG tablet Commonly known as: CELEXA Take 10 mg by mouth daily.   diclofenac Sodium 1 % Gel Commonly known as: VOLTAREN Apply 2 g topically daily as needed  (pain).   drospirenone-ethinyl estradiol 3-0.02 MG tablet Commonly known as: YAZ Take 1 tablet by mouth daily.   DULoxetine 60 MG capsule Commonly known as: CYMBALTA Take 60 mg by mouth daily.   DULoxetine 30 MG capsule Commonly known as: CYMBALTA Take 30 mg by mouth daily.   estradiol 0.5 MG tablet Commonly known as: ESTRACE Take 0.5 mg by mouth daily.   gabapentin 100 MG capsule Commonly known as: NEURONTIN Take 300 mg by mouth 3 (three) times daily as needed (pain).   HYDROcodone-acetaminophen 5-325 MG tablet Commonly known as: NORCO/VICODIN Take 1-2 tablets by mouth every 6 (six) hours as needed for moderate pain or severe pain (post op pain).   hydroxypropyl methylcellulose / hypromellose 2.5 % ophthalmic solution Commonly known as: ISOPTO TEARS / GONIOVISC Place 1 drop into both eyes 3 (three) times daily as needed for dry eyes.   loratadine 10 MG dissolvable tablet Commonly known as: CLARITIN REDITABS Take 10 mg by mouth daily.   omeprazole 40 MG capsule Commonly known as: PRILOSEC Take 40 mg by mouth daily.   rizatriptan 5 MG tablet Commonly known as: MAXALT Take 5 mg by mouth as needed for migraine. May repeat in 2 hours if needed   spironolactone 100 MG tablet Commonly known as: ALDACTONE Take 100 mg by mouth daily.   sulfamethoxazole-trimethoprim 200-40 MG/5ML suspension Commonly known as: BACTRIM Take by mouth 2 (two) times daily.   SUMAtriptan 50 MG tablet Commonly known as: IMITREX Take 50 mg by mouth every 2 (two) hours as needed.   tiZANidine 4 MG tablet Commonly known as: ZANAFLEX Take 1 tablet (4 mg total) by mouth every 6 (six) hours as needed for muscle spasms. What changed:   when to take this  reasons to take this   topiramate 50 MG tablet Commonly known as: TOPAMAX Take 50 mg by mouth in the morning and at bedtime.            Durable Medical Equipment  (From admission, onward)         Start     Ordered   02/12/21  1341  DME Walker rolling  Once       Question:  Patient needs a walker to treat with the following condition  Answer:  Primary osteoarthritis of right hip   02/12/21 1340   02/12/21 1341  DME 3 n 1  Once        02/12/21 1340   02/12/21 1341  DME Bedside commode  Once       Question:  Patient needs a bedside commode to treat with the following condition  Answer:  Primary osteoarthritis of right hip   02/12/21 1340          Diagnostic Studies: DG Chest 2 View  Result Date: 02/06/2021 CLINICAL DATA:  Preoperative examination. Patient for right hip replacement 02/12/2021. EXAM: CHEST - 2 VIEW COMPARISON:  None. FINDINGS: The lungs are clear. Heart size is normal. No pneumothorax or pleural effusion. No acute or focal bony abnormality. IMPRESSION: Negative chest. Electronically Signed   By: Drusilla Kanner M.D.   On: 02/06/2021 09:26   US RENAL  Result Date: 01/31/2021 CLINICAL DATA:  Follow-up renal lesion seen on MRI. EXAM: RENAL / URINARY TRACT ULTRASOUND COMPLETE COMPARISON:  None. FINDINGS: Right Kidney: Renal measurements: 11.1 cm x 5.5 cm x 5.3 cm = volume: 187 mL. Echogenicity within normal limits. No mass or hydronephrosis visualized. Left Kidney: Renal measurements: 12.1 cm x 4.6 cm x 5.5 cm = volume: 160 mL. Echogenicity within normal limits. A 2.0 cm x 1.9 cm x 2.0 cm anechoic structure is seen within the lower pole of the left kidney. No abnormal flow is seen within this region on color Doppler evaluation. No hydronephrosis is visualized. Bladder: Appears normal for degree of bladder distention. Other: None. IMPRESSION: Left renal cyst. Electronically Signed   By: Aram Candela M.D.   On: 01/31/2021 03:28   DG C-Arm 1-60 Min-No Report  Result Date: 02/12/2021 Fluoroscopy was utilized by the requesting physician.  No radiographic interpretation.   DG HIP OPERATIVE UNILAT W OR W/O PELVIS RIGHT  Result Date: 02/12/2021 CLINICAL DATA:  Anterior hip replacement EXAM: OPERATIVE right  HIP (WITH PELVIS IF PERFORMED) 2 VIEWS TECHNIQUE: Fluoroscopic spot image(s) were submitted for interpretation post-operatively. COMPARISON:  None FINDINGS: Total hip replacement on the right. Components appear well position. No radiographically detectable complication. IMPRESSION: Good appearance following total hip replacement on the right. Electronically Signed   By: Paulina Fusi M.D.   On: 02/12/2021 13:41    Disposition: Discharge disposition: 01-Home or Self Care       Discharge Instructions    Call MD / Call 911   Complete by: As directed    If you experience chest pain or shortness of breath, CALL 911 and be transported to the hospital emergency room.  If you develope a fever above 101 F, pus (white drainage) or increased drainage or redness at the wound, or calf pain, call your surgeon's office.   Constipation Prevention   Complete by: As directed    Drink plenty of fluids.  Prune juice may be helpful.  You may use a stool softener, such as Colace (over the counter) 100 mg twice a day.  Use MiraLax (over the counter) for constipation as needed.   Diet - low sodium heart healthy   Complete by: As directed    Discharge instructions   Complete by: As directed    INSTRUCTIONS AFTER JOINT REPLACEMENT   Remove items at home which could result in a fall. This includes throw rugs or furniture in walking pathways ICE to the affected joint every three hours while awake for 30 minutes at a time, for at least the first 3-5 days, and then as needed for pain and swelling.  Continue to use ice for pain and swelling. You may notice swelling that will  progress down to the foot and ankle.  This is normal after surgery.  Elevate your leg when you are not up walking on it.   Continue to use the breathing machine you got in the hospital (incentive spirometer) which will help keep your temperature down.  It is common for your temperature to cycle up and down following surgery, especially at night when  you are not up moving around and exerting yourself.  The breathing machine keeps your lungs expanded and your temperature down.   DIET:  As you were doing prior to hospitalization, we recommend a well-balanced diet.  DRESSING / WOUND CARE / SHOWERING  You may shower 3 days after surgery, but keep the wounds dry during showering.  You may use an occlusive plastic wrap (Press'n Seal for example), NO SOAKING/SUBMERGING IN THE BATHTUB.  If the bandage gets wet, change with a clean dry gauze.  If the incision gets wet, pat the wound dry with a clean towel.  ACTIVITY  Increase activity slowly as tolerated, but follow the weight bearing instructions below.   No driving for 6 weeks or until further direction given by your physician.  You cannot drive while taking narcotics.  No lifting or carrying greater than 10 lbs. until further directed by your surgeon. Avoid periods of inactivity such as sitting longer than an hour when not asleep. This helps prevent blood clots.  You may return to work once you are authorized by your doctor.     WEIGHT BEARING   Weight bearing as tolerated with assist device (walker, cane, etc) as directed, use it as long as suggested by your surgeon or therapist, typically at least 4-6 weeks.   EXERCISES  Results after joint replacement surgery are often greatly improved when you follow the exercise, range of motion and muscle strengthening exercises prescribed by your doctor. Safety measures are also important to protect the joint from further injury. Any time any of these exercises cause you to have increased pain or swelling, decrease what you are doing until you are comfortable again and then slowly increase them. If you have problems or questions, call your caregiver or physical therapist for advice.   Rehabilitation is important following a joint replacement. After just a few days of immobilization, the muscles of the leg can become weakened and shrink (atrophy).   These exercises are designed to build up the tone and strength of the thigh and leg muscles and to improve motion. Often times heat used for twenty to thirty minutes before working out will loosen up your tissues and help with improving the range of motion but do not use heat for the first two weeks following surgery (sometimes heat can increase post-operative swelling).   These exercises can be done on a training (exercise) mat, on the floor, on a table or on a bed. Use whatever works the best and is most comfortable for you.    Use music or television while you are exercising so that the exercises are a pleasant break in your day. This will make your life better with the exercises acting as a break in your routine that you can look forward to.   Perform all exercises about fifteen times, three times per day or as directed.  You should exercise both the operative leg and the other leg as well.  Exercises include:   Quad Sets - Tighten up the muscle on the front of the thigh (Quad) and hold for 5-10 seconds.   Straight Leg Raises -  With your knee straight (if you were given a brace, keep it on), lift the leg to 60 degrees, hold for 3 seconds, and slowly lower the leg.  Perform this exercise against resistance later as your leg gets stronger.  Leg Slides: Lying on your back, slowly slide your foot toward your buttocks, bending your knee up off the floor (only go as far as is comfortable). Then slowly slide your foot back down until your leg is flat on the floor again.  Angel Wings: Lying on your back spread your legs to the side as far apart as you can without causing discomfort.  Hamstring Strength:  Lying on your back, push your heel against the floor with your leg straight by tightening up the muscles of your buttocks.  Repeat, but this time bend your knee to a comfortable angle, and push your heel against the floor.  You may put a pillow under the heel to make it more comfortable if necessary.   A  rehabilitation program following joint replacement surgery can speed recovery and prevent re-injury in the future due to weakened muscles. Contact your doctor or a physical therapist for more information on knee rehabilitation.    CONSTIPATION  Constipation is defined medically as fewer than three stools per week and severe constipation as less than one stool per week.  Even if you have a regular bowel pattern at home, your normal regimen is likely to be disrupted due to multiple reasons following surgery.  Combination of anesthesia, postoperative narcotics, change in appetite and fluid intake all can affect your bowels.   YOU MUST use at least one of the following options; they are listed in order of increasing strength to get the job done.  They are all available over the counter, and you may need to use some, POSSIBLY even all of these options:    Drink plenty of fluids (prune juice may be helpful) and high fiber foods Colace 100 mg by mouth twice a day  Senokot for constipation as directed and as needed Dulcolax (bisacodyl), take with full glass of water  Miralax (polyethylene glycol) once or twice a day as needed.  If you have tried all these things and are unable to have a bowel movement in the first 3-4 days after surgery call either your surgeon or your primary doctor.    If you experience loose stools or diarrhea, hold the medications until you stool forms back up.  If your symptoms do not get better within 1 week or if they get worse, check with your doctor.  If you experience "the worst abdominal pain ever" or develop nausea or vomiting, please contact the office immediately for further recommendations for treatment.   ITCHING:  If you experience itching with your medications, try taking only a single pain pill, or even half a pain pill at a time.  You can also use Benadryl over the counter for itching or also to help with sleep.   TED HOSE STOCKINGS:  Use stockings on both legs until  for at least 2 weeks or as directed by physician office. They may be removed at night for sleeping.  MEDICATIONS:  See your medication summary on the "After Visit Summary" that nursing will review with you.  You may have some home medications which will be placed on hold until you complete the course of blood thinner medication.  It is important for you to complete the blood thinner medication as prescribed.  PRECAUTIONS:  If you experience chest  pain or shortness of breath - call 911 immediately for transfer to the hospital emergency department.   If you develop a fever greater that 101 F, purulent drainage from wound, increased redness or drainage from wound, foul odor from the wound/dressing, or calf pain - CONTACT YOUR SURGEON.                                                   FOLLOW-UP APPOINTMENTS:  If you do not already have a post-op appointment, please call the office for an appointment to be seen by your surgeon.  Guidelines for how soon to be seen are listed in your "After Visit Summary", but are typically between 1-4 weeks after surgery.  OTHER INSTRUCTIONS:   Knee Replacement:  Do not place pillow under knee, focus on keeping the knee straight while resting. CPM instructions: 0-90 degrees, 2 hours in the morning, 2 hours in the afternoon, and 2 hours in the evening. Place foam block, curve side up under heel at all times except when in CPM or when walking.  DO NOT modify, tear, cut, or change the foam block in any way.   DENTAL ANTIBIOTICS:  In most cases prophylactic antibiotics for Dental procdeures after total joint surgery are not necessary.  Exceptions are as follows:  1. History of prior total joint infection  2. Severely immunocompromised (Organ Transplant, cancer chemotherapy, Rheumatoid biologic meds such as Humera)  3. Poorly controlled diabetes (A1C &gt; 8.0, blood glucose over 200)  If you have one of these conditions, contact your surgeon for an antibiotic  prescription, prior to your dental procedure.   MAKE SURE YOU:  Understand these instructions.  Get help right away if you are not doing well or get worse.    Thank you for letting us be a part of your medical care team.  It is a privilege we respect greatly.  We hope these instructions will help you stay on track for a fast and full recovery!   Increase activity slowly as tolerated   Complete by: As directed          Signed: Ginger Organ Nida 02/13/2021, 8:05 AM

## 2021-02-13 NOTE — TOC Transition Note (Signed)
Transition of Care [facility]) - CM/SW Discharge Note   Patient Details  Name: [name] MRN: [id number] Date of Birth: 1966-10-23  [facility]) CM/SW Contact:  Clearance Coots, LCSW Phone Number: 02/13/2021, 9:49 AM   Clinical Narrative:    Prearranged therapy plan: HHPT-[facility], now [facility].  RW and 3 in1 delivered to the patient bedside by [employer]  Final next level of care: Home w Home Health Services Barriers to Discharge: Barriers Resolved   Patient Goals and CMS Choice        Discharge Placement                       Discharge Plan and Services                DME Arranged: 3-N-1,Walker rolling DME Agency: [employer] Date DME Agency Contacted: 02/13/21 Time DME Agency Contacted: [id number] Representative spoke with at DME Agency: Harrold Donath HH Arranged: PT HH Agency: [facility] (formerly [facility]) Date HH Agency Contacted: 02/13/21 Time HH Agency Contacted: [phone] Representative spoke with at [facility] Agency: Kathlene November  Social Determinants of Health (SDOH) Interventions     Readmission Risk Interventions No flowsheet data found.

## 2021-02-13 NOTE — Progress Notes (Signed)
Subjective: 1 Day Post-Op Procedure(s) (LRB): TOTAL HIP ARTHROPLASTY ANTERIOR APPROACH (Right)  Patient has a headache but hip is feeling well. She is hoping to go home today.  Activity level:  wbat Diet tolerance:  ok Voiding:  ok Patient reports pain as mild.    Objective: Vital signs in last 24 hours: Temp:  [97.5 F (36.4 C)-99 F (37.2 C)] 99 F (37.2 C) (03/09 0500) Pulse Rate:  [82-99] 95 (03/09 0500) Resp:  [13-20] 16 (03/09 0500) BP: (115-138)/(69-94) 124/75 (03/09 0500) SpO2:  [92 %-100 %] 96 % (03/09 0500)  Labs: Recent Labs    02/12/21 1111 02/12/21 1200 02/13/21 0320  HGB 11.2* 9.5* 9.7*   Recent Labs    02/12/21 1200 02/13/21 0320  WBC  --  10.5  RBC  --  3.05*  HCT 28.0* 28.3*  PLT  --  163   Recent Labs    02/12/21 1200 02/13/21 0320  NA 141 136  K 4.0 3.6  CL  --  105  CO2  --  21*  BUN  --  11  CREATININE  --  0.57  GLUCOSE  --  123*  CALCIUM  --  8.6*   No results for input(s): LABPT, INR in the last 72 hours.  Physical Exam:  Neurologically intact ABD soft Neurovascular intact Sensation intact distally Intact pulses distally Dorsiflexion/Plantar flexion intact Incision: dressing C/D/I and no drainage No cellulitis present Compartment soft  Assessment/Plan:  1 Day Post-Op Procedure(s) (LRB): TOTAL HIP ARTHROPLASTY ANTERIOR APPROACH (Right) Advance diet Up with therapy D/C IV fluids Discharge home with home health today after PT if cleared and doing well. Follow up in office 2 weeks post op. Continue on 81mg  asa BID for dvt prevention.    Nida 02/13/2021, 8:01 AM

## 2022-03-21 ENCOUNTER — Other Ambulatory Visit: Payer: Self-pay | Admitting: Orthopaedic Surgery

## 2022-04-09 NOTE — Progress Notes (Signed)
DUE TO COVID-19 ONLY  2 VISITOR IS ALLOWED TO COME WITH YOU AND STAY IN THE WAITING ROOM ONLY DURING PRE OP AND PROCEDURE DAY OF SURGERY.   4 VISITOR  MAY VISIT WITH YOU AFTER SURGERY IN YOUR PRIVATE ROOM DURING VISITING HOURS ONLY! ?YOU MAY HAVE ONE PERSON SPEND THE NITE WITH YOU IN YOUR ROOM AFTER SURGERY.   ? ? Your procedure is scheduled on:  ?   04/22/2022  ? Report to [facility] Main  Entrance ? ? Report to admitting at     0700am              AM ?DO NOT BRING INSURANCE CARD, PICTURE ID OR WALLET DAY OF SURGERY.  ?  ? ? Call this number if you have problems the morning of surgery [phone]  ? ? REMEMBER: NO  SOLID FOODS , CANDY, GUM OR MINTS AFTER MIDNITE THE NITE BEFORE SURGERY .       Marland Kitchen CLEAR LIQUIDS UNTIL      0645am           DAY OF SURGERY.      PLEASE FINISH ENSURE DRINK PER SURGEON ORDER  WHICH NEEDS TO BE COMPLETED AT       0645am     MORNING OF SURGERY.   ? ? ? ? ?CLEAR LIQUID DIET ? ? ?Foods Allowed      ?WATER ?BLACK COFFEE ( SUGAR OK, NO MILK, CREAM OR CREAMER) REGULAR AND DECAF  ?TEA ( SUGAR OK NO MILK, CREAM, OR CREAMER) REGULAR AND DECAF  ?PLAIN JELLO ( NO RED)  ?FRUIT ICES ( NO RED, NO FRUIT PULP)  ?POPSICLES ( NO RED)  ?JUICE- APPLE, WHITE GRAPE AND WHITE CRANBERRY  ?SPORT DRINK LIKE GATORADE ( NO RED)  ?CLEAR BROTH ( VEGETABLE , CHICKEN OR BEEF)                                                               ? ?    ? ?BRUSH YOUR TEETH MORNING OF SURGERY AND RINSE YOUR MOUTH OUT, NO CHEWING GUM CANDY OR MINTS. ?  ? ? Take these medicines the morning of surgery with A SIP OF WATER:  cymbalta, omeprazole, topamax  ? ? ?DO NOT TAKE ANY DIABETIC MEDICATIONS DAY OF YOUR SURGERY ?                  ?            You may not have any metal on your body including hair pins and  ?            piercings  Do not wear jewelry, make-up, lotions, powders or perfumes, deodorant ?            Do not wear nail polish on your fingernails.   ?           IF YOU ARE A FEMALE AND WANT TO SHAVE UNDER ARMS OR  LEGS PRIOR TO SURGERY YOU MUST DO SO AT LEAST 48 HOURS PRIOR TO SURGERY.  ?            Men may shave face and neck. ? ? Do not bring valuables to the hospital. [facility] IS NOT ?  RESPONSIBLE   FOR VALUABLES. ? Contacts, dentures or bridgework may not be worn into surgery. ? Leave suitcase in the car. After surgery it may be brought to your room. ? ?  ? Patients discharged the day of surgery will not be allowed to drive home. IF YOU ARE HAVING SURGERY AND GOING HOME THE SAME DAY, YOU MUST HAVE AN ADULT TO DRIVE YOU HOME AND BE WITH YOU FOR 24 HOURS. YOU MAY GO HOME BY TAXI OR UBER OR ORTHERWISE, BUT AN ADULT MUST ACCOMPANY YOU HOME AND STAY WITH YOU FOR 24 HOURS. ?  ? ?            Please read over the following fact sheets you were given: ?_____________________________________________________________________ ? ?[place] - Preparing for Surgery ?Before surgery, you can play an important role.  Because skin is not sterile, your skin needs to be as free of germs as possible.  You can reduce the number of germs on your skin by washing with CHG (chlorahexidine gluconate) soap before surgery.  CHG is an antiseptic cleaner which kills germs and bonds with the skin to continue killing germs even after washing. ?Please DO NOT use if you have an allergy to CHG or antibacterial soaps.  If your skin becomes reddened/irritated stop using the CHG and inform your nurse when you arrive at Short Stay. ?Do not shave (including legs and underarms) for at least 48 hours prior to the first CHG shower.  You may shave your face/neck. ?Please follow these instructions carefully: ? 1.  Shower with CHG Soap the night before surgery and the  morning of Surgery. ? 2.  If you choose to wash your hair, wash your hair first as usual with your  normal  shampoo. ? 3.  After you shampoo, rinse your hair and body thoroughly to remove the  shampoo.                           4.  Use CHG as you would any other liquid soap.  You can apply  chg directly  to the skin and wash  ?                     Gently with a scrungie or clean washcloth. ? 5.  Apply the CHG Soap to your body ONLY FROM THE NECK DOWN.   Do not use on face/ open      ?                     Wound or open sores. Avoid contact with eyes, ears mouth and genitals (private parts).  ?                     Production manager,  Genitals (private parts) with your normal soap. ?            6.  Wash thoroughly, paying special attention to the area where your surgery  will be performed. ? 7.  Thoroughly rinse your body with warm water from the neck down. ? 8.  DO NOT shower/wash with your normal soap after using and rinsing off  the CHG Soap. ?               9.  Pat yourself dry with a clean towel. ?           10.  Wear clean pajamas. ?  11.  Place clean sheets on your bed the night of your first shower and do not  sleep with pets. ?Day of Surgery : ?Do not apply any lotions/deodorants the morning of surgery.  Please wear clean clothes to the hospital/surgery center. ? ?FAILURE TO FOLLOW THESE INSTRUCTIONS MAY RESULT IN THE CANCELLATION OF YOUR SURGERY ?PATIENT SIGNATURE_________________________________ ? ?NURSE SIGNATURE__________________________________ ? ?________________________________________________________________________  ? ? ?           ?

## 2022-04-09 NOTE — Progress Notes (Addendum)
Anesthesia Review: ? ?PCP: Harrison Mons- OV- 03/30/22 for preop eval  ?Cardiologist : none  ?Chest x-ray : ?EKG :  04/01/22  have requested 12 lead ekg tracing from [employer] by fax.   ?12 lead ekg tracing 04/01/22 on chart  ?Echo : ?Stress test: ?Cardiac Cath :  ?Activity level: can do a flight of stairs wtihout difficulty  ?Sleep Study/ CPAP : has cpap - to bring mask and tubing DOS  ?Fasting Blood Sugar :      / Checks Blood Sugar -- times a day:   ?Blood Thinner/ Instructions /Last Dose: ?ASA / Instructions/ Last Dose :   ?CBC done 04/10/22 in [place] and on chart.   ?CMP done 04/02/22 on chart and in [place].   ?PT stated she was notified on5/03/2022 she has uti by PCP.  Going to pick up script for uti once leaves preop appt per pt.   ?

## 2022-04-11 ENCOUNTER — Ambulatory Visit (HOSPITAL_COMMUNITY)
Admission: RE | Admit: 2022-04-11 | Discharge: 2022-04-11 | Disposition: A | Discharge status: Home / Self Care | Payer: Commercial Managed Care - HMO | Source: Ambulatory Visit | Attending: Orthopaedic Surgery | Admitting: Orthopaedic Surgery

## 2022-04-11 ENCOUNTER — Other Ambulatory Visit: Payer: Self-pay

## 2022-04-11 ENCOUNTER — Encounter (HOSPITAL_COMMUNITY)
Admission: RE | Admit: 2022-04-11 | Discharge: 2022-04-11 | Disposition: A | Discharge status: Home / Self Care | Payer: Commercial Managed Care - HMO | Source: Ambulatory Visit | Attending: Orthopaedic Surgery | Admitting: Orthopaedic Surgery

## 2022-04-11 ENCOUNTER — Encounter (HOSPITAL_COMMUNITY): Payer: Self-pay

## 2022-04-11 VITALS — BP 126/86 | HR 80 | Temp 98.5°F | Resp 16 | Ht 66.0 in | Wt 251.0 lb

## 2022-04-11 DIAGNOSIS — Z01818 Encounter for other preprocedural examination: Secondary | ICD-10-CM | POA: Diagnosis present

## 2022-04-11 HISTORY — DX: Other specified postprocedural states: R11.2

## 2022-04-11 HISTORY — DX: Fibromyalgia: M79.7

## 2022-04-11 HISTORY — DX: Other specified postprocedural states: Z98.890

## 2022-04-11 HISTORY — DX: Family history of other specified conditions: Z84.89

## 2022-04-11 LAB — TYPE AND SCREEN
ABO/RH(D): O POS
Antibody Screen: NEGATIVE

## 2022-04-11 LAB — SURGICAL PCR SCREEN
MRSA, PCR: NEGATIVE
Staphylococcus aureus: POSITIVE — AB

## 2022-04-21 NOTE — H&P (Signed)
TOTAL HIP ADMISSION H&P ? ?Patient is admitted for left total hip arthroplasty. ? ?Subjective: ? ?Chief Complaint: left hip pain ? ?HPI: Rebecca Vargas, 56 y.o. female, has a history of pain and functional disability in the left hip(s) due to arthritis and patient has failed non-surgical conservative treatments for greater than 12 weeks to include NSAID's and/or analgesics, corticosteriod injections, flexibility and strengthening excercises, supervised PT with diminished ADL's post treatment, use of assistive devices, weight reduction as appropriate, and activity modification.  Onset of symptoms was gradual starting 5 years ago with gradually worsening course since that time.The patient noted no past surgery on the left hip(s).  Patient currently rates pain in the left hip at 10 out of 10 with activity. Patient has night pain, worsening of pain with activity and weight bearing, trendelenberg gait, pain that interfers with activities of daily living, and crepitus. Patient has evidence of subchondral cysts, subchondral sclerosis, periarticular osteophytes, and joint space narrowing by imaging studies. This condition presents safety issues increasing the risk of falls. There is no current active infection. ? ?Patient Active Problem List  ? Diagnosis Date Noted  ? Primary osteoarthritis of right hip 02/12/2021  ? Cervicalgia 10/15/2011  ? ?Past Medical History:  ?Diagnosis Date  ? Acne   ? Anemia   ? Anxiety   ? Arthritis   ? Dyslipidemia   ? Family history of adverse reaction to anesthesia   ? mother- N/V , father- slow to wake up  ? Fibrocystic breast disease in female   ? Fibromyalgia   ? GERD (gastroesophageal reflux disease)   ? Migraine   ? Multinodular goiter   ? Pt denies  ? PMDD (premenstrual dysphoric disorder)   ? PONV (postoperative nausea and vomiting)   ? Sleep apnea   ? has cpap  ?  ?Past Surgical History:  ?Procedure Laterality Date  ? CARPAL TUNNEL RELEASE  2006  ? CHOLECYSTECTOMY    ? HERNIA REPAIR     ? Age 54  ? TOTAL HIP ARTHROPLASTY Right 02/12/2021  ? Procedure: TOTAL HIP ARTHROPLASTY ANTERIOR APPROACH;  Surgeon: Melrose Nakayama, MD;  Location: [facility] ORS;  Service: Orthopedics;  Laterality: Right;  ? WISDOM TOOTH EXTRACTION    ?  ?No current facility-administered medications for this encounter.  ? ?Current Outpatient Medications  ?Medication Sig Dispense Refill Last Dose  ? cetirizine (ZYRTEC) 10 MG tablet Take 10 mg by mouth every evening.     ? cholecalciferol (VITAMIN D) 25 MCG (1000 UNIT) tablet Take 2,000 Units by mouth at bedtime.     ? DULoxetine (CYMBALTA) 30 MG capsule Take 30 mg by mouth daily. 30 mg + 60 mg=90 mg     ? DULoxetine (CYMBALTA) 60 MG capsule Take 60 mg by mouth daily. 60 mg + 30 mg=90 mg     ? estradiol (ESTRACE) 0.5 MG tablet Take 0.5 mg by mouth at bedtime.     ? gabapentin (NEURONTIN) 300 MG capsule Take 300 mg by mouth at bedtime.     ? Magnesium 500 MG TABS Take by mouth in the morning.     ? meloxicam (MOBIC) 15 MG tablet Take 15 mg by mouth at bedtime.     ? omeprazole (PRILOSEC) 40 MG capsule Take 40 mg by mouth in the morning.     ? polyethylene glycol (MIRALAX / GLYCOLAX) 17 g packet Take 17 g by mouth at bedtime.     ? progesterone (PROMETRIUM) 100 MG capsule Take 200 mg by mouth at  bedtime.     ? rizatriptan (MAXALT) 5 MG tablet Take 5 mg by mouth as needed for migraine. May repeat in 2 hours if needed     ? senna (SENOKOT) 8.6 MG TABS tablet Take 2 tablets by mouth at bedtime.     ? spironolactone (ALDACTONE) 100 MG tablet Take 100 mg by mouth in the morning.     ? topiramate (TOPAMAX) 25 MG tablet Take 50 mg by mouth in the morning.     ? ?Allergies  ?Allergen Reactions  ? Amoxicillin Hives  ?  ?Social History  ? ?Tobacco Use  ? Smoking status: Never  ? Smokeless tobacco: Never  ?Substance Use Topics  ? Alcohol use: Yes  ?  Comment: Occasional  ?  ?No family history on file.  ? ?Review of Systems  ?Musculoskeletal:  Positive for arthralgias.  ?     Left hip  ?All other systems  reviewed and are negative. ? ?Objective: ? ?Physical Exam ?Constitutional:   ?   Appearance: Normal appearance.  ?HENT:  ?   Head: Normocephalic and atraumatic.  ?   Nose: Nose normal.  ?   Mouth/Throat:  ?   Pharynx: Oropharynx is clear.  ?Eyes:  ?   Extraocular Movements: Extraocular movements intact.  ?Cardiovascular:  ?   Rate and Rhythm: Normal rate.  ?Pulmonary:  ?   Effort: Pulmonary effort is normal.  ?Abdominal:  ?   Palpations: Abdomen is soft.  ?Musculoskeletal:  ?   Cervical back: Normal range of motion.  ?   Comments: Examination of the right hip shows full range of motion.  Surgical incision is benign.  Examination left hip shows limited range of motion.  She has pain with and internal rotation.  No pain with external rotation.  Normal sensation throughout.  She is neurovascularly intact distally.   ?Skin: ?   General: Skin is warm and dry.  ?Neurological:  ?   General: No focal deficit present.  ?   Mental Status: She is alert and oriented to person, place, and time.  ?Psychiatric:     ?   Mood and Affect: Mood normal.     ?   Behavior: Behavior normal.     ?   Thought Content: Thought content normal.     ?   Judgment: Judgment normal.  ? ? ?Vital signs in last 24 hours: ?  ? ?Labs: ? ? ?Estimated body mass index is 40.51 kg/m? as calculated from the following: ?  Height as of 04/11/22: 5\' 6"  (1.676 m). ?  Weight as of 04/11/22: 113.9 kg. ? ? ?Imaging Review ?Plain radiographs demonstrate severe degenerative joint disease of the left hip(s). The bone quality appears to be good for age and reported activity level. ? ? ? ? ? ?Assessment/Plan: ? ?End stage primary arthritis, left hip(s) ? ?The patient history, physical examination, clinical judgement of the provider and imaging studies are consistent with end stage degenerative joint disease of the left hip(s) and total hip arthroplasty is deemed medically necessary. The treatment options including medical management, injection therapy, arthroscopy and  arthroplasty were discussed at length. The risks and benefits of total hip arthroplasty were presented and reviewed. The risks due to aseptic loosening, infection, stiffness, dislocation/subluxation,  thromboembolic complications and other imponderables were discussed.  The patient acknowledged the explanation, agreed to proceed with the plan and consent was signed. Patient is being admitted for inpatient treatment for surgery, pain control, PT, OT, prophylactic antibiotics, VTE prophylaxis, progressive ambulation  and ADL's and discharge planning.The patient is planning to be discharged home with home health services ? ?

## 2022-04-22 ENCOUNTER — Ambulatory Visit (HOSPITAL_COMMUNITY): Payer: Commercial Managed Care - HMO | Admitting: Physician Assistant

## 2022-04-22 ENCOUNTER — Ambulatory Visit (HOSPITAL_COMMUNITY): Payer: Commercial Managed Care - HMO

## 2022-04-22 ENCOUNTER — Ambulatory Visit (HOSPITAL_BASED_OUTPATIENT_CLINIC_OR_DEPARTMENT_OTHER): Payer: Commercial Managed Care - HMO | Admitting: Anesthesiology

## 2022-04-22 ENCOUNTER — Encounter (HOSPITAL_COMMUNITY)
Admission: RE | Disposition: A | Discharge status: Home / Self Care | Payer: Self-pay | Source: Home / Self Care | Attending: Orthopaedic Surgery

## 2022-04-22 ENCOUNTER — Ambulatory Visit (HOSPITAL_COMMUNITY)
Admission: RE | Admit: 2022-04-22 | Discharge: 2022-04-22 | Disposition: A | Discharge status: Home / Self Care | Payer: Commercial Managed Care - HMO | Attending: Orthopaedic Surgery | Admitting: Orthopaedic Surgery

## 2022-04-22 ENCOUNTER — Other Ambulatory Visit: Payer: Self-pay

## 2022-04-22 ENCOUNTER — Encounter (HOSPITAL_COMMUNITY): Payer: Self-pay | Admitting: Orthopaedic Surgery

## 2022-04-22 DIAGNOSIS — D649 Anemia, unspecified: Secondary | ICD-10-CM | POA: Diagnosis not present

## 2022-04-22 DIAGNOSIS — K219 Gastro-esophageal reflux disease without esophagitis: Secondary | ICD-10-CM | POA: Diagnosis not present

## 2022-04-22 DIAGNOSIS — G4733 Obstructive sleep apnea (adult) (pediatric): Secondary | ICD-10-CM | POA: Diagnosis not present

## 2022-04-22 DIAGNOSIS — F419 Anxiety disorder, unspecified: Secondary | ICD-10-CM | POA: Diagnosis not present

## 2022-04-22 DIAGNOSIS — R519 Headache, unspecified: Secondary | ICD-10-CM | POA: Insufficient documentation

## 2022-04-22 DIAGNOSIS — G709 Myoneural disorder, unspecified: Secondary | ICD-10-CM | POA: Diagnosis not present

## 2022-04-22 DIAGNOSIS — M1612 Unilateral primary osteoarthritis, left hip: Secondary | ICD-10-CM

## 2022-04-22 DIAGNOSIS — G4730 Sleep apnea, unspecified: Secondary | ICD-10-CM | POA: Insufficient documentation

## 2022-04-22 DIAGNOSIS — F32A Depression, unspecified: Secondary | ICD-10-CM | POA: Insufficient documentation

## 2022-04-22 DIAGNOSIS — D759 Disease of blood and blood-forming organs, unspecified: Secondary | ICD-10-CM | POA: Diagnosis not present

## 2022-04-22 DIAGNOSIS — Z9989 Dependence on other enabling machines and devices: Secondary | ICD-10-CM

## 2022-04-22 DIAGNOSIS — M797 Fibromyalgia: Secondary | ICD-10-CM | POA: Insufficient documentation

## 2022-04-22 DIAGNOSIS — Z6841 Body Mass Index (BMI) 40.0 and over, adult: Secondary | ICD-10-CM | POA: Diagnosis not present

## 2022-04-22 HISTORY — PX: TOTAL HIP ARTHROPLASTY: SHX124

## 2022-04-22 LAB — HEMOGLOBIN AND HEMATOCRIT, BLOOD
HCT: 36.9 % (ref 36.0–46.0)
Hemoglobin: 12.4 g/dL (ref 12.0–15.0)

## 2022-04-22 SURGERY — ARTHROPLASTY, HIP, TOTAL, ANTERIOR APPROACH
Anesthesia: Spinal | Site: Hip | Laterality: Left

## 2022-04-22 MED ORDER — SCOPOLAMINE 1 MG/3DAYS TD PT72
1.0000 | MEDICATED_PATCH | TRANSDERMAL | Status: DC
  Administered 2022-04-22: 1.5 mg via TRANSDERMAL
  Filled 2022-04-22: qty 1

## 2022-04-22 MED ORDER — ACETAMINOPHEN 325 MG PO TABS: 325.0000 mg | ORAL_TABLET | Freq: Four times a day (QID) | ORAL | Status: DC | PRN

## 2022-04-22 MED ORDER — 0.9 % SODIUM CHLORIDE (POUR BTL) OPTIME
TOPICAL | Status: DC | PRN
  Administered 2022-04-22: 1000 mL

## 2022-04-22 MED ORDER — METHOCARBAMOL 500 MG IVPB - SIMPLE MED
500.0000 mg | Freq: Four times a day (QID) | INTRAVENOUS | Status: DC | PRN
  Administered 2022-04-22: 500 mg via INTRAVENOUS

## 2022-04-22 MED ORDER — PHENYLEPHRINE HCL-NACL 20-0.9 MG/250ML-% IV SOLN
INTRAVENOUS | Status: DC | PRN
  Administered 2022-04-22: 50 ug/min via INTRAVENOUS

## 2022-04-22 MED ORDER — ORAL CARE MOUTH RINSE: 15.0000 mL | Freq: Once | OROMUCOSAL | Status: AC

## 2022-04-22 MED ORDER — AMISULPRIDE (ANTIEMETIC) 5 MG/2ML IV SOLN: 10.0000 mg | Freq: Once | INTRAVENOUS | Status: DC | PRN

## 2022-04-22 MED ORDER — ONDANSETRON HCL 4 MG/2ML IJ SOLN
INTRAMUSCULAR | Status: AC
  Filled 2022-04-22: qty 2

## 2022-04-22 MED ORDER — DEXAMETHASONE SODIUM PHOSPHATE 10 MG/ML IJ SOLN
INTRAMUSCULAR | Status: AC
  Filled 2022-04-22: qty 1

## 2022-04-22 MED ORDER — LACTATED RINGERS IV BOLUS
500.0000 mL | Freq: Once | INTRAVENOUS | Status: AC
  Administered 2022-04-22: 500 mL via INTRAVENOUS

## 2022-04-22 MED ORDER — DEXAMETHASONE SODIUM PHOSPHATE 10 MG/ML IJ SOLN
INTRAMUSCULAR | Status: DC | PRN
  Administered 2022-04-22: 10 mg via INTRAVENOUS

## 2022-04-22 MED ORDER — CHLORHEXIDINE GLUCONATE 0.12 % MT SOLN
15.0000 mL | Freq: Once | OROMUCOSAL | Status: AC
  Administered 2022-04-22: 15 mL via OROMUCOSAL

## 2022-04-22 MED ORDER — VANCOMYCIN HCL 1500 MG/300ML IV SOLN
1500.0000 mg | INTRAVENOUS | Status: AC
  Administered 2022-04-22: 1500 mg via INTRAVENOUS
  Filled 2022-04-22: qty 300

## 2022-04-22 MED ORDER — LACTATED RINGERS IV SOLN: INTRAVENOUS | Status: DC

## 2022-04-22 MED ORDER — PROPOFOL 1000 MG/100ML IV EMUL
INTRAVENOUS | Status: AC
  Filled 2022-04-22: qty 100

## 2022-04-22 MED ORDER — TRANEXAMIC ACID-NACL 1000-0.7 MG/100ML-% IV SOLN
INTRAVENOUS | Status: AC
  Filled 2022-04-22: qty 100

## 2022-04-22 MED ORDER — LACTATED RINGERS IV BOLUS
250.0000 mL | Freq: Once | INTRAVENOUS | Status: AC
  Administered 2022-04-22: 250 mL via INTRAVENOUS

## 2022-04-22 MED ORDER — ASPIRIN EC 81 MG PO TBEC: 81.0000 mg | DELAYED_RELEASE_TABLET | Freq: Two times a day (BID) | ORAL | 0 refills | Status: DC

## 2022-04-22 MED ORDER — MIDAZOLAM HCL 2 MG/2ML IJ SOLN
INTRAMUSCULAR | Status: AC
  Filled 2022-04-22: qty 2

## 2022-04-22 MED ORDER — BUPIVACAINE LIPOSOME 1.3 % IJ SUSP
INTRAMUSCULAR | Status: DC | PRN
Start: 2022-04-22 — End: 2022-04-22
  Administered 2022-04-22: 10 mL

## 2022-04-22 MED ORDER — POVIDONE-IODINE 10 % EX SWAB
2.0000 | Freq: Once | CUTANEOUS | Status: AC
Start: 2022-04-22 — End: 2022-04-22
  Administered 2022-04-22: 2 via TOPICAL

## 2022-04-22 MED ORDER — PROPOFOL 10 MG/ML IV BOLUS
INTRAVENOUS | Status: AC
  Filled 2022-04-22: qty 20

## 2022-04-22 MED ORDER — BUPIVACAINE IN DEXTROSE 0.75-8.25 % IT SOLN
INTRATHECAL | Status: DC | PRN
  Administered 2022-04-22: 1.6 mL via INTRATHECAL

## 2022-04-22 MED ORDER — FENTANYL CITRATE (PF) 100 MCG/2ML IJ SOLN
INTRAMUSCULAR | Status: AC
Start: 2022-04-22 — End: ?
  Filled 2022-04-22: qty 2

## 2022-04-22 MED ORDER — FENTANYL CITRATE (PF) 100 MCG/2ML IJ SOLN
INTRAMUSCULAR | Status: DC | PRN
Start: 2022-04-22 — End: 2022-04-22
  Administered 2022-04-22: 100 ug via INTRAVENOUS

## 2022-04-22 MED ORDER — TRANEXAMIC ACID-NACL 1000-0.7 MG/100ML-% IV SOLN
1000.0000 mg | Freq: Once | INTRAVENOUS | Status: AC
  Administered 2022-04-22: 1000 mg via INTRAVENOUS

## 2022-04-22 MED ORDER — MIDAZOLAM HCL 5 MG/5ML IJ SOLN
INTRAMUSCULAR | Status: DC | PRN
  Administered 2022-04-22: 2 mg via INTRAVENOUS

## 2022-04-22 MED ORDER — TIZANIDINE HCL 4 MG PO TABS: 4.0000 mg | ORAL_TABLET | Freq: Four times a day (QID) | ORAL | 1 refills | Status: DC | PRN

## 2022-04-22 MED ORDER — METHOCARBAMOL 500 MG IVPB - SIMPLE MED
INTRAVENOUS | Status: AC
  Filled 2022-04-22: qty 50

## 2022-04-22 MED ORDER — ONDANSETRON HCL 4 MG/2ML IJ SOLN: 4.0000 mg | Freq: Four times a day (QID) | INTRAMUSCULAR | Status: DC | PRN

## 2022-04-22 MED ORDER — HYDROMORPHONE HCL 1 MG/ML IJ SOLN: 0.2500 mg | INTRAMUSCULAR | Status: DC | PRN

## 2022-04-22 MED ORDER — OXYCODONE HCL 5 MG/5ML PO SOLN: 5.0000 mg | Freq: Once | ORAL | Status: DC | PRN

## 2022-04-22 MED ORDER — ONDANSETRON HCL 4 MG/2ML IJ SOLN: 4.0000 mg | Freq: Once | INTRAMUSCULAR | Status: DC | PRN

## 2022-04-22 MED ORDER — STERILE WATER FOR IRRIGATION IR SOLN
Status: DC | PRN
  Administered 2022-04-22: 2000 mL

## 2022-04-22 MED ORDER — MORPHINE SULFATE (PF) 2 MG/ML IV SOLN: 0.5000 mg | INTRAVENOUS | Status: DC | PRN

## 2022-04-22 MED ORDER — BUPIVACAINE LIPOSOME 1.3 % IJ SUSP: 10.0000 mL | Freq: Once | INTRAMUSCULAR | Status: DC

## 2022-04-22 MED ORDER — HYDROCODONE-ACETAMINOPHEN 7.5-325 MG PO TABS: 1.0000 | ORAL_TABLET | ORAL | Status: DC | PRN

## 2022-04-22 MED ORDER — METHOCARBAMOL 500 MG PO TABS: 500.0000 mg | ORAL_TABLET | Freq: Four times a day (QID) | ORAL | Status: DC | PRN

## 2022-04-22 MED ORDER — HYDROCODONE-ACETAMINOPHEN 5-325 MG PO TABS: 1.0000 | ORAL_TABLET | ORAL | Status: DC | PRN

## 2022-04-22 MED ORDER — METOCLOPRAMIDE HCL 5 MG/ML IJ SOLN: 5.0000 mg | Freq: Three times a day (TID) | INTRAMUSCULAR | Status: DC | PRN

## 2022-04-22 MED ORDER — ONDANSETRON HCL 4 MG PO TABS: 4.0000 mg | ORAL_TABLET | Freq: Four times a day (QID) | ORAL | Status: DC | PRN

## 2022-04-22 MED ORDER — PROPOFOL 500 MG/50ML IV EMUL
INTRAVENOUS | Status: DC | PRN
  Administered 2022-04-22: 100 ug/kg/min via INTRAVENOUS

## 2022-04-22 MED ORDER — BUPIVACAINE-EPINEPHRINE (PF) 0.25% -1:200000 IJ SOLN
INTRAMUSCULAR | Status: DC | PRN
  Administered 2022-04-22: 30 mL

## 2022-04-22 MED ORDER — ACETAMINOPHEN 500 MG PO TABS: 500.0000 mg | ORAL_TABLET | Freq: Four times a day (QID) | ORAL | Status: DC

## 2022-04-22 MED ORDER — BUPIVACAINE-EPINEPHRINE (PF) 0.25% -1:200000 IJ SOLN
INTRAMUSCULAR | Status: AC
  Filled 2022-04-22: qty 30

## 2022-04-22 MED ORDER — ROCURONIUM BROMIDE 10 MG/ML (PF) SYRINGE
PREFILLED_SYRINGE | INTRAVENOUS | Status: AC
  Filled 2022-04-22: qty 10

## 2022-04-22 MED ORDER — PROPOFOL 10 MG/ML IV BOLUS
INTRAVENOUS | Status: DC | PRN
  Administered 2022-04-22: 10 mg via INTRAVENOUS

## 2022-04-22 MED ORDER — VANCOMYCIN HCL IN DEXTROSE 1-5 GM/200ML-% IV SOLN: 1000.0000 mg | Freq: Two times a day (BID) | INTRAVENOUS | Status: DC

## 2022-04-22 MED ORDER — TRANEXAMIC ACID-NACL 1000-0.7 MG/100ML-% IV SOLN
1000.0000 mg | INTRAVENOUS | Status: AC
  Administered 2022-04-22: 1000 mg via INTRAVENOUS
  Filled 2022-04-22: qty 100

## 2022-04-22 MED ORDER — METOCLOPRAMIDE HCL 5 MG PO TABS: 5.0000 mg | ORAL_TABLET | Freq: Three times a day (TID) | ORAL | Status: DC | PRN

## 2022-04-22 MED ORDER — OXYCODONE HCL 5 MG PO TABS: 5.0000 mg | ORAL_TABLET | Freq: Once | ORAL | Status: DC | PRN

## 2022-04-22 MED ORDER — PHENYLEPHRINE HCL-NACL 20-0.9 MG/250ML-% IV SOLN
INTRAVENOUS | Status: AC
  Filled 2022-04-22: qty 250

## 2022-04-22 MED ORDER — PROMETHAZINE HCL 12.5 MG PO TABS: 12.5000 mg | ORAL_TABLET | Freq: Four times a day (QID) | ORAL | 1 refills | Status: DC | PRN

## 2022-04-22 MED ORDER — PROPOFOL 500 MG/50ML IV EMUL
INTRAVENOUS | Status: AC
  Filled 2022-04-22: qty 50

## 2022-04-22 MED ORDER — KETOROLAC TROMETHAMINE 15 MG/ML IJ SOLN
15.0000 mg | Freq: Four times a day (QID) | INTRAMUSCULAR | Status: DC
Start: 2022-04-22 — End: 2022-04-22

## 2022-04-22 MED ORDER — TRANEXAMIC ACID 1000 MG/10ML IV SOLN
INTRAVENOUS | Status: DC | PRN
  Administered 2022-04-22: 2000 mg via TOPICAL

## 2022-04-22 MED ORDER — BUPIVACAINE LIPOSOME 1.3 % IJ SUSP
INTRAMUSCULAR | Status: AC
  Filled 2022-04-22: qty 10

## 2022-04-22 MED ORDER — ONDANSETRON HCL 4 MG/2ML IJ SOLN
INTRAMUSCULAR | Status: DC | PRN
  Administered 2022-04-22: 4 mg via INTRAVENOUS

## 2022-04-22 MED ORDER — TRANEXAMIC ACID 1000 MG/10ML IV SOLN
2000.0000 mg | INTRAVENOUS | Status: DC
  Filled 2022-04-22: qty 20

## 2022-04-22 SURGICAL SUPPLY — 47 items
BAG COUNTER SPONGE SURGICOUNT (BAG) ×1 IMPLANT
BAG DECANTER FOR FLEXI CONT (MISCELLANEOUS) ×2 IMPLANT
BAG SPNG CNTER NS LX DISP (BAG) ×1
BLADE SAW SGTL 18X1.27X75 (BLADE) ×2 IMPLANT
BOOTIES KNEE HIGH SLOAN (MISCELLANEOUS) ×2 IMPLANT
CELLS DAT CNTRL 66122 CELL SVR (MISCELLANEOUS) ×1 IMPLANT
COVER PERINEAL POST (MISCELLANEOUS) ×2 IMPLANT
COVER SURGICAL LIGHT HANDLE (MISCELLANEOUS) ×2 IMPLANT
DRAPE FOOT SWITCH (DRAPES) ×2 IMPLANT
DRAPE IMP U-DRAPE 54X76 (DRAPES) ×2 IMPLANT
DRAPE STERI IOBAN 125X83 (DRAPES) ×2 IMPLANT
DRAPE U-SHAPE 47X51 STRL (DRAPES) ×4 IMPLANT
DRSG AQUACEL AG ADV 3.5X 6 (GAUZE/BANDAGES/DRESSINGS) ×2 IMPLANT
DURAPREP 26ML APPLICATOR (WOUND CARE) ×2 IMPLANT
ELECT BLADE TIP CTD 4 INCH (ELECTRODE) ×2 IMPLANT
ELECT REM PT RETURN 15FT ADLT (MISCELLANEOUS) ×2 IMPLANT
GLOVE BIO SURGEON STRL SZ8 (GLOVE) ×4 IMPLANT
GLOVE BIOGEL PI IND STRL 8 (GLOVE) ×2 IMPLANT
GLOVE BIOGEL PI INDICATOR 8 (GLOVE) ×2
GOWN STRL REUS W/ TWL XL LVL3 (GOWN DISPOSABLE) ×2 IMPLANT
GOWN STRL REUS W/TWL XL LVL3 (GOWN DISPOSABLE) ×4
HEAD M SROM 36MM 2 (Hips) IMPLANT
HOLDER FOLEY CATH W/STRAP (MISCELLANEOUS) ×2 IMPLANT
KIT TURNOVER KIT A (KITS) IMPLANT
LINER NEUTRAL 52X36MM PLUS 4 (Liner) ×1 IMPLANT
MANIFOLD NEPTUNE II (INSTRUMENTS) ×2 IMPLANT
NEEDLE HYPO 22GX1.5 SAFETY (NEEDLE) ×2 IMPLANT
NS IRRIG 1000ML POUR BTL (IV SOLUTION) ×2 IMPLANT
PACK ANTERIOR HIP CUSTOM (KITS) ×2 IMPLANT
PENCIL SMOKE EVACUATOR (MISCELLANEOUS) ×1 IMPLANT
PIN SECTOR W/GRIP ACE CUP 52MM (Hips) ×1 IMPLANT
PROTECTOR NERVE ULNAR (MISCELLANEOUS) ×2 IMPLANT
RETRACTOR WND ALEXIS 18 MED (MISCELLANEOUS) ×1 IMPLANT
RTRCTR WOUND ALEXIS 18CM MED (MISCELLANEOUS) ×2
SCREW 6.5MMX25MM (Screw) ×1 IMPLANT
SCREW PINN CAN 6.5X20 (Screw) ×1 IMPLANT
SPIKE FLUID TRANSFER (MISCELLANEOUS) ×2 IMPLANT
SROM M HEAD 36MM 2 (Hips) ×2 IMPLANT
STEM FEMORAL SZ5 HIGH ACTIS (Stem) ×1 IMPLANT
SUT ETHIBOND NAB CT1 #1 30IN (SUTURE) ×4 IMPLANT
SUT VIC AB 1 CT1 36 (SUTURE) ×2 IMPLANT
SUT VIC AB 2-0 CT1 27 (SUTURE) ×2
SUT VIC AB 2-0 CT1 TAPERPNT 27 (SUTURE) ×1 IMPLANT
SUT VICRYL AB 3-0 FS1 BRD 27IN (SUTURE) ×2 IMPLANT
SUT VLOC 180 0 24IN GS25 (SUTURE) ×2 IMPLANT
SYR 50ML LL SCALE MARK (SYRINGE) ×2 IMPLANT
TRAY FOLEY MTR SLVR 16FR STAT (SET/KITS/TRAYS/PACK) ×2 IMPLANT

## 2022-04-22 NOTE — Op Note (Signed)
PRE-OP DIAGNOSIS:  LEFT HIP DEGENERATIVE JOINT DISEASE ?POST-OP DIAGNOSIS: same ?PROCEDURE:  LEFT TOTAL HIP ARTHROPLASTY ANTERIOR APPROACH ?ANESTHESIA:  Spinal and MAC ?SURGEON:  Melrose Nakayama MD ?ASSISTANT:  Loni Dolly PA-C ? ? ?INDICATIONS FOR PROCEDURE:  The patient is a 56 y.o. female with a long history of a painful hip.  This has persisted despite multiple conservative measures.  The patient has persisted with pain and dysfunction making rest and activity difficult.  A total hip replacement is offered as surgical treatment.  Informed operative consent was obtained after discussion of possible complications including reaction to anesthesia, infection, neurovascular injury, dislocation, DVT, PE, and death.  The importance of the postoperative rehab program to optimize result was stressed with the patient. ? ?SUMMARY OF FINDINGS AND PROCEDURE:  Under the above anesthesia through a anterior approach an the Hana table a left THR was performed.  The patient had severe degenerative change and poor bone quality.  We used DePuy components to replace the hip and these were size 5 high offset Actis femur capped with a -2 66m metal hip ball.  On the acetabular side we used a size 54 3 hole shell with a plus 4 neutral polyethylene liner.  We did not use a hole eliminator and did place one screw in the cup.  ALoni DollyPA-C assisted throughout and was invaluable to the completion of the case in that he helped position and retract while I performed the procedure.  He also closed simultaneously to help minimize OR time.  I used fluoroscopy throughout the case to check position of components and leg lengths and read all these views myself. ? ?DESCRIPTION OF PROCEDURE:  The patient was taken to the OR suite where the above anesthetic was applied.  The patient was then positioned on the Hana table supine.  All bony prominences were appropriately padded.  Prep and drape was then performed in normal sterile fashion.  The  patient was given vancomycin preoperative antibiotic and an appropriate time out was performed.  We then took an anterior approach to the left hip.  Dissection was taken through adipose to the tensor fascia lata fascia.  This structure was incised longitudinally and we dissected in the intermuscular interval just medial to this muscle.  Cobra retractors were placed superior and inferior to the femoral neck superficial to the capsule.  A capsular incision was then made and the retractors were placed along the femoral neck.  Xray was brought in to get a good level for the femoral neck cut which was made with an oscillating saw and osteotome.  The femoral head was removed with a corkscrew.  The acetabulum was exposed and some labral tissues were excised. Reaming was taken to the inside wall of the pelvis and sequentially up to 1 mm smaller than the actual component.  A trial of components was done and then the aforementioned acetabular shell was placed in appropriate tilt and anteversion confirmed by fluoroscopy. The liner was placed along with the hole eliminator and attention was turned to the femur.  The leg was brought down and over into adduction and the elevator bar was used to raise the femur up gently in the wound.  The piriformis was released with care taken to preserve the obturator internus attachment and all of the posterior capsule. The femur was reamed and then broached to the appropriate size.  A trial reduction was done and the aforementioned head and neck assembly gave uKoreathe best stability in extension with external  rotation.  Leg lengths were felt to be about equal by fluoroscopic exam.  The trial components were removed and the wound irrigated.  We then placed the femoral component in appropriate anteversion.  The head was applied to a dry stem neck and the hip again reduced.  It was again stable in the aforementioned position.  The would was irrigated again followed by re-approximation of anterior  capsule with ethibond suture. Tensor fascia was repaired with V-loc suture  followed by deep closure with #O and #2 undyed vicryl.  Skin was closed with subQ stitch and steristrips followed by a sterile dressing.  EBL and IOF can be obtained from anesthesia records. ? ?DISPOSITION:  The patient was taken to PACU to potentially go home same day depending on ability to walk and tolerate liquids.  ?

## 2022-04-22 NOTE — Anesthesia Preprocedure Evaluation (Addendum)
Anesthesia Evaluation  ?Patient identified by MRN, date of birth, ID band ?Patient awake ? ? ? ?Reviewed: ?Allergy & Precautions, NPO status , Patient's Chart, lab work & pertinent test results ? ?History of Anesthesia Complications ?(+) PONV, Family history of anesthesia reaction and history of anesthetic complications ? ?Airway ?Mallampati: II ? ?TM Distance: >3 FB ?Neck ROM: Full ? ? ? Dental ?no notable dental hx. ?(+) Teeth Intact, Dental Advisory Given ?  ?Pulmonary ?sleep apnea and Continuous Positive Airway Pressure Ventilation ,  ?  ?Pulmonary exam normal ?breath sounds clear to auscultation ? ? ? ? ? ? Cardiovascular ?negative cardio ROS ?Normal cardiovascular exam ?Rhythm:Regular Rate:Normal ? ? ?  ?Neuro/Psych ? Headaches, PSYCHIATRIC DISORDERS Anxiety Depression  Neuromuscular disease   ? GI/Hepatic ?Neg liver ROS, GERD  Medicated,  ?Endo/Other  ?Morbid obesity ? Renal/GU ?negative Renal ROS  ?negative genitourinary ?  ?Musculoskeletal ? ?(+) Arthritis , Osteoarthritis,  Fibromyalgia -Left Hip DJD  ? Abdominal ?(+) + obese,   ?Peds ? Hematology ? ?(+) Blood dyscrasia, anemia ,   ?Anesthesia Other Findings ? ? Reproductive/Obstetrics ? ?  ? ? ? ? ? ? ? ? ? ? ? ? ? ?  ?  ? ? ? ? ? ? ? ? ?Anesthesia Physical ?Anesthesia Plan ? ?ASA: 3 ? ?Anesthesia Plan: Spinal  ? ?Post-op Pain Management:   ? ?Induction: Intravenous ? ?PONV Risk Score and Plan: 4 or greater and Treatment may vary due to age or medical condition and Ondansetron ? ?Airway Management Planned: Natural Airway and Simple Face Mask ? ?Additional Equipment: None ? ?Intra-op Plan:  ? ?Post-operative Plan:  ? ?Informed Consent: I have reviewed the patients History and Physical, chart, labs and discussed the procedure including the risks, benefits and alternatives for the proposed anesthesia with the patient or authorized representative who has indicated his/her understanding and acceptance.  ? ? ? ?Dental advisory  given ? ?Plan Discussed with: CRNA and Anesthesiologist ? ?Anesthesia Plan Comments:   ? ? ? ? ? ? ?Anesthesia Quick Evaluation ? ?

## 2022-04-22 NOTE — Anesthesia Procedure Notes (Signed)
Spinal ? ?Patient location during procedure: OR ?Start time: 04/22/2022 10:00 AM ?End time: 04/22/2022 10:10 AM ?Reason for block: surgical anesthesia ?Staffing ?Performed: anesthesiologist and resident/CRNA  ?Anesthesiologist: Mal Amabile, MD ?Resident/CRNA: Doran Clay, CRNA ?Preanesthetic Checklist ?Completed: patient identified, IV checked, site marked, risks and benefits discussed, surgical consent, monitors and equipment checked, pre-op evaluation and timeout performed ?Spinal Block ?Patient position: sitting ?Prep: DuraPrep and site prepped and draped ?Patient monitoring: heart rate, cardiac monitor, continuous pulse ox and blood pressure ?Approach: midline ?Location: L3-4 ?Injection technique: single-shot ?Needle ?Needle type: Pencan  ?Needle gauge: 24 G ?Needle length: 9 cm ?Needle insertion depth: 8 cm ?Assessment ?Sensory level: T6 ?Events: CSF return and second provider ?Additional Notes ?Patient tolerated procedure well. Adequate sensory level. Technically difficult due to levoscoliosis. ? ? ? ? ? ?

## 2022-04-22 NOTE — Transfer of Care (Signed)
Immediate Anesthesia Transfer of Care Note ? ?Patient: Rebecca Vargas ? ?Procedure(s) Performed: LEFT TOTAL HIP ARTHROPLASTY ANTERIOR APPROACH (Left: Hip) ? ?Patient Location: PACU ? ?Anesthesia Type:Spinal ? ?Level of Consciousness: sedated ? ?Airway & Oxygen Therapy: Patient Spontanous Breathing and Patient connected to face mask oxygen ? ?Post-op Assessment: Report given to RN and Post -op Vital signs reviewed and stable ? ?Post vital signs: Reviewed and stable ? ?Last Vitals:  ?Vitals Value Taken Time  ?BP    ?Temp    ?Pulse 82 04/22/22 1214  ?Resp 14 04/22/22 1214  ?SpO2 97 % 04/22/22 1214  ?Vitals shown include unvalidated device data. ? ?Last Pain:  ?Vitals:  ? 04/22/22 0810  ?TempSrc:   ?PainSc: 2   ?   ? ?Patients Stated Pain Goal: 0 (04/22/22 0810) ? ?Complications: No notable events documented. ?

## 2022-04-22 NOTE — Anesthesia Postprocedure Evaluation (Signed)
Anesthesia Post Note ? ?Patient: Rebecca Vargas ? ?Procedure(s) Performed: LEFT TOTAL HIP ARTHROPLASTY ANTERIOR APPROACH (Left: Hip) ? ?  ? ?Patient location during evaluation: PACU ?Anesthesia Type: Spinal ?Level of consciousness: oriented and awake and alert ?Pain management: pain level controlled ?Vital Signs Assessment: post-procedure vital signs reviewed and stable ?Respiratory status: spontaneous breathing, respiratory function stable and nonlabored ventilation ?Cardiovascular status: blood pressure returned to baseline and stable ?Postop Assessment: no headache, no backache, no apparent nausea or vomiting, spinal receding and patient able to bend at knees ?Anesthetic complications: no ? ? ?No notable events documented. ? ?Last Vitals:  ?Vitals:  ? 04/22/22 1245 04/22/22 1254  ?BP: 122/89   ?Pulse: 85 79  ?Resp: 15 18  ?Temp:    ?SpO2: 98% 97%  ?  ?Last Pain:  ?Vitals:  ? 04/22/22 1245  ?TempSrc:   ?PainSc: 0-No pain  ? ? ?  ?  ?  ?  ?  ?  ? ?Gunther Zawadzki A. ? ? ? ? ?

## 2022-04-22 NOTE — Interval H&P Note (Signed)
History and Physical Interval Note: ? ?04/22/2022 ?9:06 AM ? ?Rebecca Vargas  has presented today for surgery, with the diagnosis of LEFT HIP DEGENERATIVE JOINT DISEASE.  The various methods of treatment have been discussed with the patient and family. After consideration of risks, benefits and other options for treatment, the patient has consented to  Procedure(s): ?LEFT TOTAL HIP ARTHROPLASTY ANTERIOR APPROACH (Left) as a surgical intervention.  The patient's history has been reviewed, patient examined, no change in status, stable for surgery.  I have reviewed the patient's chart and labs.  Questions were answered to the patient's satisfaction.   ? ? ?[name] [name] ? ? ?

## 2022-04-22 NOTE — Evaluation (Signed)
Physical Therapy Evaluation ?Patient Details ?Name: Rebecca Vargas ?MRN: [id number] ?DOB: 07/07/1966 ?Today's Date: 04/22/2022 ? ?History of Present Illness ? 56 yo female S/P LTHA-direct anterior. S/P R THA 3/22. PMH: migraines, anxiety.  ?Clinical Impression ? The patient tolerated ambulation  usin g RW, no dizziness. LLE strength WFL for ambulation. Patient has no steps at home. Has DME . Patient has achieved PT goals to DC home.    ?   ? ?Recommendations for follow up therapy are one component of a multi-disciplinary discharge planning process, led by the attending physician.  Recommendations may be updated based on patient status, additional functional criteria and insurance authorization. ? ?Follow Up Recommendations Follow physician's recommendations for discharge plan and follow up therapies ? ?  ?Assistance Recommended at Discharge PRN  ?Patient can return home with the following ? A little help with bathing/dressing/bathroom;Help with stairs or ramp for entrance;Assistance with cooking/housework;Assist for transportation ? ?  ?Equipment Recommendations None recommended by PT  ?Recommendations for Other Services ?    ?  ?Functional Status Assessment Patient has had a recent decline in their functional status and demonstrates the ability to make significant improvements in function in a reasonable and predictable amount of time.  ? ?  ?Precautions / Restrictions Precautions ?Precautions: Fall ?Restrictions ?Weight Bearing Restrictions: No  ? ?  ? ?Mobility ? Bed Mobility ?Overal bed mobility: Needs Assistance ?Bed Mobility: Supine to Sit, Sit to Supine ?  ?  ?Supine to sit: Supervision ?Sit to supine: Min assist ?  ?General bed mobility comments: assist legs onto  stretcher ?  ? ?Transfers ?Overall transfer level: Needs assistance ?Equipment used: Rolling walker (2 wheels) ?Transfers: Sit to/from Stand ?Sit to Stand: Min guard ?  ?  ?  ?  ?  ?General transfer comment: from stretcher and toilet ?   ? ?Ambulation/Gait ?Ambulation/Gait assistance: Min guard ?Gait Distance (Feet): 40 Feet (x 2) ?Assistive device: Rolling walker (2 wheels) ?Gait Pattern/deviations: Step-through pattern ?  ?  ?  ?General Gait Details: gait steady with RW ? ?Stairs ?  ?  ?  ?  ?  ? ?Wheelchair Mobility ?  ? ?Modified Rankin (Stroke Patients Only) ?  ? ?  ? ?Balance Overall balance assessment: No apparent balance deficits (not formally assessed) ?  ?  ?  ?  ?  ?  ?  ?  ?  ?  ?  ?  ?  ?  ?  ?  ?  ?  ?   ? ? ? ?Pertinent Vitals/Pain Pain Assessment ?Pain Assessment: Faces ?Faces Pain Scale: Hurts a little bit ?Pain Location: sore l and tight left hip ?Pain Descriptors / Indicators: Sore, Tightness ?Pain Intervention(s): Monitored during session  ? ? ?Home Living Family/patient expects to be discharged to:: Private residence ?Living Arrangements: Spouse/significant other ?Available Help at Discharge: Family;Available 24 hours/day ?Type of Home: House ?Home Access: Stairs to enter ?  ?Entrance Stairs-Number of Steps: thresshold ?  ?Home Layout: One level ?Home Equipment: Rolling Walker (2 wheels);Cane - quad;Shower seat ?   ?  ?Prior Function Prior Level of Function : Independent/Modified Independent ?  ?  ?  ?  ?  ?  ?  ?  ?  ? ? ?Hand Dominance  ?   ? ?  ?Extremity/Trunk Assessment  ? Upper Extremity Assessment ?Upper Extremity Assessment: Overall WFL for tasks assessed ?  ? ?Lower Extremity Assessment ?Lower Extremity Assessment: Overall WFL for tasks assessed ?  ? ?Cervical / Trunk Assessment ?Cervical /   Trunk Assessment: Normal  ?Communication  ? Communication: No difficulties  ?Cognition Arousal/Alertness: Awake/alert ?Behavior During Therapy: [facility] for tasks assessed/performed ?Overall Cognitive Status: Within Functional Limits for tasks assessed ?  ?  ?  ?  ?  ?  ?  ?  ?  ?  ?  ?  ?  ?  ?  ?  ?  ?  ?  ? ?  ?General Comments   ? ?  ?Exercises    ? ?Assessment/Plan  ?  ?PT Assessment All further PT needs can be met in the next venue  of care  ?PT Problem List Decreased range of motion;Decreased mobility;Decreased activity tolerance ? ?   ?  ?PT Treatment Interventions     ? ?PT Goals (Current goals can be found in the Care Plan section)  ?Acute Rehab PT Goals ?Patient Stated Goal: go home ?PT Goal Formulation: All assessment and education complete, DC therapy ? ?  ?Frequency   ?  ? ? ?Co-evaluation   ?  ?  ?  ?  ? ? ?  ?AM-PAC PT "6 Clicks" Mobility  ?Outcome Measure Help needed turning from your back to your side while in a flat bed without using bedrails?: A Little ?Help needed moving from lying on your back to sitting on the side of a flat bed without using bedrails?: A Little ?Help needed moving to and from a bed to a chair (including a wheelchair)?: A Little ?Help needed standing up from a chair using your arms (e.g., wheelchair or bedside chair)?: A Little ?Help needed to walk in hospital room?: A Little ?Help needed climbing 3-5 steps with a railing? : A Little ?6 Click Score: 18 ? ?  ?End of Session Equipment Utilized During Treatment: Gait belt ?Activity Tolerance: Patient tolerated treatment well ?Patient left: in bed;with call bell/phone within reach ?Nurse Communication: Mobility status ?PT Visit Diagnosis: Muscle weakness (generalized) (M62.81);Difficulty in walking, not elsewhere classified (R26.2) ?  ? ?Time: 1245-8099 ?PT Time Calculation (min) (ACUTE ONLY): 20 min ? ? ?Charges:   PT Evaluation ?$PT Eval Low Complexity: 1 Low ?  ?  ?   ? ? ?Tresa Endo PT ?Acute Rehabilitation Services ?Pager [phone] ?Office [phone] ? ? ?Derinda Bartus, Shella Maxim ?04/22/2022, 3:35 PM ?

## 2022-04-23 ENCOUNTER — Encounter (HOSPITAL_COMMUNITY): Payer: Self-pay | Admitting: Orthopaedic Surgery

## 2022-12-23 ENCOUNTER — Other Ambulatory Visit: Payer: Self-pay | Admitting: Nurse Practitioner

## 2022-12-23 DIAGNOSIS — E041 Nontoxic single thyroid nodule: Secondary | ICD-10-CM

## 2023-01-08 ENCOUNTER — Ambulatory Visit
Admission: RE | Admit: 2023-01-08 | Discharge: 2023-01-08 | Disposition: A | Discharge status: Home / Self Care | Payer: Commercial Managed Care - HMO | Source: Ambulatory Visit | Attending: Nurse Practitioner | Admitting: Nurse Practitioner

## 2023-01-08 DIAGNOSIS — E041 Nontoxic single thyroid nodule: Secondary | ICD-10-CM

## 2023-01-12 ENCOUNTER — Other Ambulatory Visit: Payer: Self-pay | Admitting: Nurse Practitioner

## 2023-01-12 DIAGNOSIS — E041 Nontoxic single thyroid nodule: Secondary | ICD-10-CM

## 2023-01-14 ENCOUNTER — Other Ambulatory Visit: Payer: Self-pay | Admitting: Neurological Surgery

## 2023-01-22 ENCOUNTER — Other Ambulatory Visit: Payer: Self-pay | Admitting: Neurological Surgery

## 2023-01-30 ENCOUNTER — Other Ambulatory Visit: Payer: Self-pay | Admitting: Nurse Practitioner

## 2023-01-30 ENCOUNTER — Ambulatory Visit
Admission: RE | Admit: 2023-01-30 | Discharge: 2023-01-30 | Disposition: A | Discharge status: Home / Self Care | Payer: Commercial Managed Care - HMO | Source: Ambulatory Visit | Attending: Nurse Practitioner | Admitting: Nurse Practitioner

## 2023-01-30 ENCOUNTER — Other Ambulatory Visit (HOSPITAL_COMMUNITY)
Admission: RE | Admit: 2023-01-30 | Discharge: 2023-01-30 | Disposition: A | Discharge status: Home / Self Care | Payer: Commercial Managed Care - HMO | Source: Ambulatory Visit | Attending: Nurse Practitioner | Admitting: Nurse Practitioner

## 2023-01-30 DIAGNOSIS — E041 Nontoxic single thyroid nodule: Secondary | ICD-10-CM

## 2023-02-02 ENCOUNTER — Other Ambulatory Visit: Payer: Self-pay

## 2023-02-02 ENCOUNTER — Encounter (HOSPITAL_COMMUNITY): Payer: Self-pay

## 2023-02-02 ENCOUNTER — Encounter (HOSPITAL_COMMUNITY)
Admission: RE | Admit: 2023-02-02 | Discharge: 2023-02-02 | Disposition: A | Discharge status: Home / Self Care | Payer: Commercial Managed Care - HMO | Source: Ambulatory Visit | Attending: Neurological Surgery | Admitting: Neurological Surgery

## 2023-02-02 VITALS — BP 133/93 | Temp 98.3°F | Resp 17 | Ht 67.0 in | Wt 249.9 lb

## 2023-02-02 DIAGNOSIS — M542 Cervicalgia: Secondary | ICD-10-CM | POA: Insufficient documentation

## 2023-02-02 DIAGNOSIS — Z01812 Encounter for preprocedural laboratory examination: Secondary | ICD-10-CM | POA: Insufficient documentation

## 2023-02-02 DIAGNOSIS — Z01818 Encounter for other preprocedural examination: Secondary | ICD-10-CM

## 2023-02-02 HISTORY — DX: Hidradenitis suppurativa: L73.2

## 2023-02-02 LAB — CBC
HCT: 38.6 % (ref 36.0–46.0)
Hemoglobin: 13.1 g/dL (ref 12.0–15.0)
MCH: 31.7 pg (ref 26.0–34.0)
MCHC: 33.9 g/dL (ref 30.0–36.0)
MCV: 93.5 fL (ref 80.0–100.0)
Platelets: 271 10*3/uL (ref 150–400)
RBC: 4.13 MIL/uL (ref 3.87–5.11)
RDW: 11.6 % (ref 11.5–15.5)
WBC: 6.7 10*3/uL (ref 4.0–10.5)
nRBC: 0 % (ref 0.0–0.2)

## 2023-02-02 LAB — SURGICAL PCR SCREEN
MRSA, PCR: NEGATIVE
Staphylococcus aureus: NEGATIVE

## 2023-02-02 NOTE — Progress Notes (Signed)
PCP - Varney Baas RD  Cardiologist - no  EP-no  Endocrine- Dr Leafy Ro- thyroid  Pulm-no  Chest x-ray - na  EKG - na  Stress Test - no  ECHO - no  Cardiac Cath - no  Sleep Study - yes CPAP - Yes  LABS-PCR, T/S, CBC  ASA no  ERAS-no  HA1C-na Fasting Blood Sugar - na Checks Blood Sugar _0____ times a day  Anesthesia-  Pt denies having chest pain, sob, or fever at this time. All instructions explained to the pt, with a verbal understanding of the material. Pt agrees to go over the instructions while at home for a better understanding. Pt also instructed to self quarantine after being tested for COVID-19. The opportunity to ask questions was provided.   Mrs. Saca asked if she would be given FFP?  I said I do not know, I called Dr. Rubbie Battiest scheduler, Lexine Baton and left a message.  Nikki said FFP is being prepared in case she needs them.  I called patient and informed her.

## 2023-02-02 NOTE — Pre-Procedure Instructions (Signed)
Rebecca Vargas  02/02/2023      Your procedure is scheduled on THURSDAY, FEBRUARY 29.  Report to [facility] Admitting at  5:30 AM              Your surgery is scheduled to begin at 7:30 AM   Call this number if you have problems the morning of surgery:  [phone] this is the pre- surgery desk.  If you have questions today - Thursday- call [phone]- ask for any nurse  If you experience any cold or flu symptoms such as cough, fever, chills, shortness of breath, etc. between now and your scheduled surgery, please notify us at the above number.   Remember:  Do not eat or drink after midnight Wednesday.  Take these medicines the morning of surgery with A SIP OF WATER : DULoxetine (CYMBALTA)   omeprazole (PRILOSEC)  topiramate (TOPAMAX)  DULoxetine (CYMBALTA)  Take if needed: acetaminophen (TYLENOL) DULoxetine (CYMBALTA) methocarbamol (ROBAXIN)  rizatriptan (MAXALT)   STOP taking  Aspirin Products (Goody Powder, Excedrin Migraine), Ibuprofen (Advil), Naproxen (Aleve), Vitamins and Herbal Products (ie Fish Oil).                Special instructions:    [name]- Preparing For Surgery  Before surgery, you can play an important role. Because skin is not sterile, your skin needs to be as free of germs as possible. You can reduce the number of germs on your skin by washing with CHG (chlorahexidine gluconate) Soap before surgery.  CHG is an antiseptic cleaner which kills germs and bonds with the skin to continue killing germs even after washing.    Oral Hygiene is also important to reduce your risk of infection.  Remember - BRUSH YOUR TEETH THE MORNING OF SURGERY WITH YOUR REGULAR TOOTHPASTE  Please do not use if you have an allergy to CHG or antibacterial soaps. If your skin becomes reddened/irritated stop using the CHG.  Do not shave (including legs and underarms) for at least 48 hours prior to first CHG shower. It is OK to shave your face.  Please follow  these instructions carefully.   Shower the NIGHT BEFORE SURGERY and the MORNING OF SURGERY with CHG.   If you chose to wash your hair, wash your hair first as usual with your normal shampoo.  After you shampoo, wash your face and private area with the soap you use at home, then rinse your hair and body thoroughly to remove the shampoo and soap.  Use CHG as you would any other liquid soap. You can apply CHG directly to the skin and wash gently with a scrungie or a clean washcloth.   Apply the CHG Soap to your body ONLY FROM THE NECK DOWN.  Do not use on open wounds or open sores. Avoid contact with your eyes, ears, mouth and genitals (private parts).   Wash thoroughly, paying special attention to the area where your surgery will be performed.  Thoroughly rinse your body with warm water from the neck down.  DO NOT shower/wash with your normal soap after using and rinsing off the CHG Soap.  Pat yourself dry with a CLEAN TOWEL.  Wear CLEAN PAJAMAS to bed the night before surgery, wear comfortable clothes the morning of surgery  Place CLEAN SHEETS on your bed the night of your first shower and DO NOT SLEEP WITH PETS.  Day of Surgery: Shower as instructed above. Do not apply any deodorants/lotions, powders or colognes.  Please wear  clean clothes to the hospital/surgery center.   Remember to brush your teeth WITH YOUR REGULAR TOOTHPASTE.  Do not wear jewelry, make-up or nail polish.  Do not shave 48 hours prior to surgery.  Men may shave face and neck.  Do not bring valuables to the hospital.  [facility] is not responsible for any belongings or valuables.  Contacts, dentures or bridgework may not be worn into surgery.  Leave your suitcase in the car.  After surgery it may be brought to your room.  For patients admitted to the hospital, discharge time will be determined by your treatment team.  Patients discharged the day of surgery will not be allowed to drive home.   Please read  over the fact sheets that you were given.     SURGICAL WAITING ROOM VISITATION Patients having surgery or a procedure may have no more than 2 support people in the waiting area - these visitors may rotate.   Children under the age of 72 must have an adult with them who is not the patient. If the patient needs to stay at the hospital during part of their recovery, the visitor guidelines for inpatient rooms apply. Pre-op nurse will coordinate an appropriate time for 1 support person to accompany patient in pre-op.  This support person may not rotate.    Please refer to the [facility] website for the visitor guidelines for Inpatients (after your surgery is over and you are in a regular room).

## 2023-02-02 NOTE — Pre-Procedure Instructions (Signed)
Rebecca Vargas  02/02/2023      Your procedure is scheduled on THURSDAY, FEBRUARY 29.  Report to [facility] Admitting at  5:30 AM              Your surgery is scheduled to begin at 7:30 AM   Call this number if you have problems the morning of surgery:  [phone] this is the pre- surgery desk.  If you have questions today - Thursday- call [phone]- ask for any nurse  If you experience any cold or flu symptoms such as cough, fever, chills, shortness of breath, etc. between now and your scheduled surgery, please notify us at the above number.   Remember:  Do not eat or drink after midnight Wednesday.  Take these medicines the morning of surgery with A SIP OF WATER : DULoxetine (CYMBALTA)   omeprazole (PRILOSEC)  topiramate (TOPAMAX)  DULoxetine (CYMBALTA)  Take if needed: acetaminophen (TYLENOL) DULoxetine (CYMBALTA) methocarbamol (ROBAXIN)  rizatriptan (MAXALT)   STOP taking  Aspirin Products (Goody Powder, Excedrin Migraine), Ibuprofen (Advil), Naproxen (Aleve), Vitamins and Herbal Products (ie Fish Oil).                Special instructions:    [place]- Preparing For Surgery  Before surgery, you can play an important role. Because skin is not sterile, your skin needs to be as free of germs as possible. You can reduce the number of germs on your skin by washing with CHG (chlorahexidine gluconate) Soap before surgery.  CHG is an antiseptic cleaner which kills germs and bonds with the skin to continue killing germs even after washing.    Oral Hygiene is also important to reduce your risk of infection.  Remember - BRUSH YOUR TEETH THE MORNING OF SURGERY WITH YOUR REGULAR TOOTHPASTE  Please do not use if you have an allergy to CHG or antibacterial soaps. If your skin becomes reddened/irritated stop using the CHG.  Do not shave (including legs and underarms) for at least 48 hours prior to first CHG shower. It is OK to shave your face.  Please follow  these instructions carefully.   Shower the NIGHT BEFORE SURGERY and the MORNING OF SURGERY with CHG.   If you chose to wash your hair, wash your hair first as usual with your normal shampoo.  After you shampoo, wash your face and private area with the soap you use at home, then rinse your hair and body thoroughly to remove the shampoo and soap.  Use CHG as you would any other liquid soap. You can apply CHG directly to the skin and wash gently with a scrungie or a clean washcloth.   Apply the CHG Soap to your body ONLY FROM THE NECK DOWN.  Do not use on open wounds or open sores. Avoid contact with your eyes, ears, mouth and genitals (private parts).   Wash thoroughly, paying special attention to the area where your surgery will be performed.  Thoroughly rinse your body with warm water from the neck down.  DO NOT shower/wash with your normal soap after using and rinsing off the CHG Soap.  Pat yourself dry with a CLEAN TOWEL.  Wear CLEAN PAJAMAS to bed the night before surgery, wear comfortable clothes the morning of surgery  Place CLEAN SHEETS on your bed the night of your first shower and DO NOT SLEEP WITH PETS.  Day of Surgery: Shower as instructed above. Do not apply any deodorants/lotions, powders or colognes.  Please wear  clean clothes to the hospital/surgery center.   Remember to brush your teeth WITH YOUR REGULAR TOOTHPASTE.  Do not wear jewelry, make-up or nail polish.  Do not shave 48 hours prior to surgery.  Men may shave face and neck.  Do not bring valuables to the hospital.  [facility] is not responsible for any belongings or valuables.  Contacts, dentures or bridgework may not be worn into surgery.  Leave your suitcase in the car.  After surgery it may be brought to your room.  For patients admitted to the hospital, discharge time will be determined by your treatment team.  Patients discharged the day of surgery will not be allowed to drive home.   Please read  over the fact sheets that you were given.     SURGICAL WAITING ROOM VISITATION Patients having surgery or a procedure may have no more than 2 support people in the waiting area - these visitors may rotate.   Children under the age of 2 must have an adult with them who is not the patient. If the patient needs to stay at the hospital during part of their recovery, the visitor guidelines for inpatient rooms apply. Pre-op nurse will coordinate an appropriate time for 1 support person to accompany patient in pre-op.  This support person may not rotate.    Please refer to the [facility] website for the visitor guidelines for Inpatients (after your surgery is over and you are in a regular room).

## 2023-02-04 ENCOUNTER — Other Ambulatory Visit: Payer: Self-pay | Admitting: Neurological Surgery

## 2023-02-04 LAB — CYTOLOGY - NON PAP

## 2023-02-04 MED ORDER — VANCOMYCIN HCL 1500 MG/300ML IV SOLN
1500.0000 mg | INTRAVENOUS | Status: AC
  Administered 2023-02-05 (×2): 1500 mg via INTRAVENOUS
  Filled 2023-02-04 (×2): qty 300

## 2023-02-05 ENCOUNTER — Ambulatory Visit (HOSPITAL_COMMUNITY): Payer: Commercial Managed Care - HMO | Admitting: Certified Registered"

## 2023-02-05 ENCOUNTER — Inpatient Hospital Stay (HOSPITAL_COMMUNITY)
Admission: AD | Admit: 2023-02-05 | Discharge: 2023-02-08 | DRG: 454 | Disposition: A | Discharge status: Home Health | Payer: Commercial Managed Care - HMO | Source: Ambulatory Visit | Attending: Neurological Surgery | Admitting: Neurological Surgery

## 2023-02-05 ENCOUNTER — Ambulatory Visit (HOSPITAL_COMMUNITY): Payer: Commercial Managed Care - HMO

## 2023-02-05 ENCOUNTER — Other Ambulatory Visit: Payer: Self-pay

## 2023-02-05 ENCOUNTER — Inpatient Hospital Stay (HOSPITAL_COMMUNITY): Payer: Commercial Managed Care - HMO

## 2023-02-05 ENCOUNTER — Encounter (HOSPITAL_COMMUNITY)
Admission: AD | Disposition: A | Discharge status: Home Health | Payer: Self-pay | Source: Ambulatory Visit | Attending: Neurological Surgery

## 2023-02-05 ENCOUNTER — Encounter (HOSPITAL_COMMUNITY): Payer: Self-pay | Admitting: Neurological Surgery

## 2023-02-05 DIAGNOSIS — G43909 Migraine, unspecified, not intractable, without status migrainosus: Secondary | ICD-10-CM | POA: Diagnosis present

## 2023-02-05 DIAGNOSIS — E785 Hyperlipidemia, unspecified: Secondary | ICD-10-CM | POA: Diagnosis present

## 2023-02-05 DIAGNOSIS — M4155 Other secondary scoliosis, thoracolumbar region: Secondary | ICD-10-CM | POA: Diagnosis present

## 2023-02-05 DIAGNOSIS — Z781 Physical restraint status: Secondary | ICD-10-CM | POA: Diagnosis not present

## 2023-02-05 DIAGNOSIS — Z79899 Other long term (current) drug therapy: Secondary | ICD-10-CM | POA: Diagnosis not present

## 2023-02-05 DIAGNOSIS — N6019 Diffuse cystic mastopathy of unspecified breast: Secondary | ICD-10-CM | POA: Diagnosis present

## 2023-02-05 DIAGNOSIS — Z96643 Presence of artificial hip joint, bilateral: Secondary | ICD-10-CM | POA: Diagnosis present

## 2023-02-05 DIAGNOSIS — G4733 Obstructive sleep apnea (adult) (pediatric): Secondary | ICD-10-CM | POA: Diagnosis present

## 2023-02-05 DIAGNOSIS — J9601 Acute respiratory failure with hypoxia: Secondary | ICD-10-CM

## 2023-02-05 DIAGNOSIS — Z9911 Dependence on respirator [ventilator] status: Secondary | ICD-10-CM | POA: Diagnosis not present

## 2023-02-05 DIAGNOSIS — M4726 Other spondylosis with radiculopathy, lumbar region: Secondary | ICD-10-CM | POA: Diagnosis present

## 2023-02-05 DIAGNOSIS — M4804 Spinal stenosis, thoracic region: Secondary | ICD-10-CM | POA: Diagnosis present

## 2023-02-05 DIAGNOSIS — K219 Gastro-esophageal reflux disease without esophagitis: Secondary | ICD-10-CM | POA: Diagnosis present

## 2023-02-05 DIAGNOSIS — Z7984 Long term (current) use of oral hypoglycemic drugs: Secondary | ICD-10-CM

## 2023-02-05 DIAGNOSIS — F419 Anxiety disorder, unspecified: Secondary | ICD-10-CM | POA: Diagnosis present

## 2023-02-05 DIAGNOSIS — M419 Scoliosis, unspecified: Secondary | ICD-10-CM | POA: Diagnosis present

## 2023-02-05 DIAGNOSIS — E876 Hypokalemia: Secondary | ICD-10-CM | POA: Diagnosis not present

## 2023-02-05 DIAGNOSIS — F3281 Premenstrual dysphoric disorder: Secondary | ICD-10-CM | POA: Diagnosis present

## 2023-02-05 DIAGNOSIS — M4150 Other secondary scoliosis, site unspecified: Secondary | ICD-10-CM | POA: Diagnosis present

## 2023-02-05 DIAGNOSIS — M797 Fibromyalgia: Secondary | ICD-10-CM | POA: Diagnosis present

## 2023-02-05 DIAGNOSIS — G8929 Other chronic pain: Secondary | ICD-10-CM | POA: Diagnosis present

## 2023-02-05 DIAGNOSIS — Z7982 Long term (current) use of aspirin: Secondary | ICD-10-CM | POA: Diagnosis not present

## 2023-02-05 DIAGNOSIS — Z880 Allergy status to penicillin: Secondary | ICD-10-CM

## 2023-02-05 DIAGNOSIS — M48061 Spinal stenosis, lumbar region without neurogenic claudication: Secondary | ICD-10-CM | POA: Diagnosis present

## 2023-02-05 DIAGNOSIS — E042 Nontoxic multinodular goiter: Secondary | ICD-10-CM | POA: Diagnosis present

## 2023-02-05 DIAGNOSIS — M4125 Other idiopathic scoliosis, thoracolumbar region: Secondary | ICD-10-CM | POA: Diagnosis not present

## 2023-02-05 HISTORY — PX: POSTERIOR LUMBAR FUSION 4 LEVEL: SHX6037

## 2023-02-05 LAB — POCT I-STAT 7, (LYTES, BLD GAS, ICA,H+H)
Acid-base deficit: 4 mmol/L — ABNORMAL HIGH (ref 0.0–2.0)
Acid-base deficit: 5 mmol/L — ABNORMAL HIGH (ref 0.0–2.0)
Acid-base deficit: 6 mmol/L — ABNORMAL HIGH (ref 0.0–2.0)
Acid-base deficit: 4 mmol/L — ABNORMAL HIGH (ref 0.0–2.0)
Acid-base deficit: 4 mmol/L — ABNORMAL HIGH (ref 0.0–2.0)
Acid-base deficit: 5 mmol/L — ABNORMAL HIGH (ref 0.0–2.0)
Bicarbonate: 23 mmol/L (ref 20.0–28.0)
Bicarbonate: 21 mmol/L (ref 20.0–28.0)
Bicarbonate: 20.5 mmol/L (ref 20.0–28.0)
Bicarbonate: 21.6 mmol/L (ref 20.0–28.0)
Bicarbonate: 21.7 mmol/L (ref 20.0–28.0)
Bicarbonate: 23.2 mmol/L (ref 20.0–28.0)
Calcium, Ion: 1.32 mmol/L (ref 1.15–1.40)
Calcium, Ion: 1.27 mmol/L (ref 1.15–1.40)
Calcium, Ion: 1.26 mmol/L (ref 1.15–1.40)
Calcium, Ion: 1.24 mmol/L (ref 1.15–1.40)
Calcium, Ion: 1.17 mmol/L (ref 1.15–1.40)
Calcium, Ion: 1.21 mmol/L (ref 1.15–1.40)
HCT: 35 % — ABNORMAL LOW (ref 36.0–46.0)
HCT: 32 % — ABNORMAL LOW (ref 36.0–46.0)
HCT: 32 % — ABNORMAL LOW (ref 36.0–46.0)
HCT: 27 % — ABNORMAL LOW (ref 36.0–46.0)
HCT: 34 % — ABNORMAL LOW (ref 36.0–46.0)
HCT: 35 % — ABNORMAL LOW (ref 36.0–46.0)
Hemoglobin: 11.9 g/dL — ABNORMAL LOW (ref 12.0–15.0)
Hemoglobin: 10.9 g/dL — ABNORMAL LOW (ref 12.0–15.0)
Hemoglobin: 10.9 g/dL — ABNORMAL LOW (ref 12.0–15.0)
Hemoglobin: 9.2 g/dL — ABNORMAL LOW (ref 12.0–15.0)
Hemoglobin: 11.6 g/dL — ABNORMAL LOW (ref 12.0–15.0)
Hemoglobin: 11.9 g/dL — ABNORMAL LOW (ref 12.0–15.0)
O2 Saturation: 97 %
O2 Saturation: 98 %
O2 Saturation: 99 %
O2 Saturation: 99 %
O2 Saturation: 99 %
O2 Saturation: 99 %
Patient temperature: 98.3
Potassium: 3.8 mmol/L (ref 3.5–5.1)
Potassium: 3.9 mmol/L (ref 3.5–5.1)
Potassium: 4.3 mmol/L (ref 3.5–5.1)
Potassium: 4.3 mmol/L (ref 3.5–5.1)
Potassium: 4.2 mmol/L (ref 3.5–5.1)
Potassium: 3.9 mmol/L (ref 3.5–5.1)
Sodium: 139 mmol/L (ref 135–145)
Sodium: 138 mmol/L (ref 135–145)
Sodium: 140 mmol/L (ref 135–145)
Sodium: 139 mmol/L (ref 135–145)
Sodium: 139 mmol/L (ref 135–145)
Sodium: 139 mmol/L (ref 135–145)
TCO2: 24 mmol/L (ref 22–32)
TCO2: 22 mmol/L (ref 22–32)
TCO2: 22 mmol/L (ref 22–32)
TCO2: 23 mmol/L (ref 22–32)
TCO2: 23 mmol/L (ref 22–32)
TCO2: 25 mmol/L (ref 22–32)
pCO2 arterial: 50.1 mmHg — ABNORMAL HIGH (ref 32–48)
pCO2 arterial: 41.3 mmHg (ref 32–48)
pCO2 arterial: 41.3 mmHg (ref 32–48)
pCO2 arterial: 40.9 mmHg (ref 32–48)
pCO2 arterial: 41.5 mmHg (ref 32–48)
pCO2 arterial: 57.5 mmHg — ABNORMAL HIGH (ref 32–48)
pH, Arterial: 7.269 — ABNORMAL LOW (ref 7.35–7.45)
pH, Arterial: 7.314 — ABNORMAL LOW (ref 7.35–7.45)
pH, Arterial: 7.304 — ABNORMAL LOW (ref 7.35–7.45)
pH, Arterial: 7.329 — ABNORMAL LOW (ref 7.35–7.45)
pH, Arterial: 7.326 — ABNORMAL LOW (ref 7.35–7.45)
pH, Arterial: 7.212 — ABNORMAL LOW (ref 7.35–7.45)
pO2, Arterial: 106 mmHg (ref 83–108)
pO2, Arterial: 120 mmHg — ABNORMAL HIGH (ref 83–108)
pO2, Arterial: 145 mmHg — ABNORMAL HIGH (ref 83–108)
pO2, Arterial: 150 mmHg — ABNORMAL HIGH (ref 83–108)
pO2, Arterial: 157 mmHg — ABNORMAL HIGH (ref 83–108)
pO2, Arterial: 174 mmHg — ABNORMAL HIGH (ref 83–108)

## 2023-02-05 LAB — POCT I-STAT, CHEM 8
BUN: 11 mg/dL (ref 6–20)
Calcium, Ion: 1.19 mmol/L (ref 1.15–1.40)
Chloride: 107 mmol/L (ref 98–111)
Creatinine, Ser: 0.5 mg/dL (ref 0.44–1.00)
Glucose, Bld: 120 mg/dL — ABNORMAL HIGH (ref 70–99)
HCT: 33 % — ABNORMAL LOW (ref 36.0–46.0)
Hemoglobin: 11.2 g/dL — ABNORMAL LOW (ref 12.0–15.0)
Potassium: 4 mmol/L (ref 3.5–5.1)
Sodium: 140 mmol/L (ref 135–145)
TCO2: 23 mmol/L (ref 22–32)

## 2023-02-05 LAB — MRSA NEXT GEN BY PCR, NASAL: MRSA by PCR Next Gen: NOT DETECTED

## 2023-02-05 LAB — PREPARE RBC (CROSSMATCH)

## 2023-02-05 SURGERY — POSTERIOR LUMBAR FUSION 4 LEVEL
Anesthesia: General

## 2023-02-05 MED ORDER — DEXAMETHASONE SODIUM PHOSPHATE 10 MG/ML IJ SOLN
INTRAMUSCULAR | Status: DC | PRN
  Administered 2023-02-05: 10 mg via INTRAVENOUS

## 2023-02-05 MED ORDER — METHOCARBAMOL 500 MG PO TABS: 500.0000 mg | ORAL_TABLET | Freq: Four times a day (QID) | ORAL | Status: DC | PRN

## 2023-02-05 MED ORDER — SCOPOLAMINE 1 MG/3DAYS TD PT72
MEDICATED_PATCH | TRANSDERMAL | Status: DC | PRN
  Administered 2023-02-05: 1 via TRANSDERMAL

## 2023-02-05 MED ORDER — VANCOMYCIN HCL 1500 MG/300ML IV SOLN
1500.0000 mg | INTRAVENOUS | Status: AC
  Filled 2023-02-05 (×2): qty 300

## 2023-02-05 MED ORDER — LIDOCAINE-EPINEPHRINE 1 %-1:100000 IJ SOLN
INTRAMUSCULAR | Status: AC
  Filled 2023-02-05: qty 1

## 2023-02-05 MED ORDER — VANCOMYCIN HCL 1000 MG IV SOLR
INTRAVENOUS | Status: AC
  Filled 2023-02-05: qty 20

## 2023-02-05 MED ORDER — PHENYLEPHRINE HCL-NACL 20-0.9 MG/250ML-% IV SOLN
INTRAVENOUS | Status: DC | PRN
  Administered 2023-02-05: 15 ug/min via INTRAVENOUS

## 2023-02-05 MED ORDER — SODIUM CHLORIDE 0.9 % IV SOLN: INTRAVENOUS | Status: DC | PRN

## 2023-02-05 MED ORDER — ONDANSETRON HCL 4 MG/2ML IJ SOLN
INTRAMUSCULAR | Status: AC
  Filled 2023-02-05: qty 2

## 2023-02-05 MED ORDER — VANCOMYCIN HCL 1500 MG/300ML IV SOLN
1500.0000 mg | Freq: Two times a day (BID) | INTRAVENOUS | Status: DC
  Filled 2023-02-05: qty 300

## 2023-02-05 MED ORDER — ONDANSETRON HCL 4 MG PO TABS: 4.0000 mg | ORAL_TABLET | Freq: Four times a day (QID) | ORAL | Status: DC | PRN

## 2023-02-05 MED ORDER — ORAL CARE MOUTH RINSE: 15.0000 mL | OROMUCOSAL | Status: DC | PRN

## 2023-02-05 MED ORDER — BACITRACIN ZINC 500 UNIT/GM EX OINT
TOPICAL_OINTMENT | CUTANEOUS | Status: AC
  Filled 2023-02-05: qty 28.35

## 2023-02-05 MED ORDER — ONDANSETRON HCL 4 MG/2ML IJ SOLN: 4.0000 mg | Freq: Four times a day (QID) | INTRAMUSCULAR | Status: DC | PRN

## 2023-02-05 MED ORDER — VANCOMYCIN HCL 1500 MG/300ML IV SOLN
1500.0000 mg | Freq: Two times a day (BID) | INTRAVENOUS | Status: DC
  Administered 2023-02-06 – 2023-02-08 (×5): 1500 mg via INTRAVENOUS
  Filled 2023-02-05 (×5): qty 300

## 2023-02-05 MED ORDER — ONDANSETRON HCL 4 MG/2ML IJ SOLN
4.0000 mg | Freq: Four times a day (QID) | INTRAMUSCULAR | Status: DC | PRN
  Administered 2023-02-06 – 2023-02-07 (×2): 4 mg via INTRAVENOUS
  Filled 2023-02-05 (×2): qty 2

## 2023-02-05 MED ORDER — FENTANYL CITRATE PF 50 MCG/ML IJ SOSY
50.0000 ug | PREFILLED_SYRINGE | Freq: Once | INTRAMUSCULAR | Status: AC
  Administered 2023-02-05: 50 ug via INTRAVENOUS

## 2023-02-05 MED ORDER — HEMOSTATIC AGENTS (NO CHARGE) OPTIME
TOPICAL | Status: DC | PRN
  Administered 2023-02-05: 1 via TOPICAL

## 2023-02-05 MED ORDER — PANTOPRAZOLE SODIUM 40 MG IV SOLR
40.0000 mg | INTRAVENOUS | Status: DC
  Administered 2023-02-06: 40 mg via INTRAVENOUS
  Filled 2023-02-05: qty 10

## 2023-02-05 MED ORDER — METHOCARBAMOL 500 MG PO TABS
500.0000 mg | ORAL_TABLET | Freq: Four times a day (QID) | ORAL | Status: DC | PRN
  Administered 2023-02-06: 500 mg
  Filled 2023-02-05: qty 1

## 2023-02-05 MED ORDER — DOCUSATE SODIUM 100 MG PO CAPS: 100.0000 mg | ORAL_CAPSULE | Freq: Two times a day (BID) | ORAL | Status: DC

## 2023-02-05 MED ORDER — PROPOFOL 500 MG/50ML IV EMUL
INTRAVENOUS | Status: DC | PRN
  Administered 2023-02-05: 50 ug/kg/min via INTRAVENOUS

## 2023-02-05 MED ORDER — TOPIRAMATE 25 MG PO TABS
100.0000 mg | ORAL_TABLET | Freq: Every morning | ORAL | Status: DC
  Administered 2023-02-06: 100 mg
  Filled 2023-02-05: qty 4

## 2023-02-05 MED ORDER — DOCUSATE SODIUM 50 MG/5ML PO LIQD
100.0000 mg | Freq: Two times a day (BID) | ORAL | Status: DC
  Administered 2023-02-05 – 2023-02-06 (×2): 100 mg
  Filled 2023-02-05 (×2): qty 10

## 2023-02-05 MED ORDER — BACITRACIN ZINC 500 UNIT/GM EX OINT
TOPICAL_OINTMENT | CUTANEOUS | Status: DC | PRN
  Administered 2023-02-05: 1 via TOPICAL

## 2023-02-05 MED ORDER — FENTANYL CITRATE PF 50 MCG/ML IJ SOSY: 50.0000 ug | PREFILLED_SYRINGE | INTRAMUSCULAR | Status: DC | PRN

## 2023-02-05 MED ORDER — DULOXETINE HCL 20 MG PO CPEP
60.0000 mg | ORAL_CAPSULE | Freq: Every day | ORAL | Status: DC
  Administered 2023-02-07 – 2023-02-08 (×2): 60 mg via ORAL
  Filled 2023-02-05 (×3): qty 3

## 2023-02-05 MED ORDER — FENTANYL 2500MCG IN NS 250ML (10MCG/ML) PREMIX INFUSION
50.0000 ug/h | INTRAVENOUS | Status: DC
  Administered 2023-02-05: 50 ug/h via INTRAVENOUS
  Filled 2023-02-05: qty 250

## 2023-02-05 MED ORDER — ESTRADIOL 0.5 MG PO TABS
0.5000 mg | ORAL_TABLET | Freq: Every day | ORAL | Status: DC
  Filled 2023-02-05: qty 1

## 2023-02-05 MED ORDER — CHLORHEXIDINE GLUCONATE CLOTH 2 % EX PADS: 6.0000 | MEDICATED_PAD | Freq: Once | CUTANEOUS | Status: DC

## 2023-02-05 MED ORDER — LIDOCAINE 2% (20 MG/ML) 5 ML SYRINGE
INTRAMUSCULAR | Status: AC
  Filled 2023-02-05: qty 5

## 2023-02-05 MED ORDER — SENNA 8.6 MG PO TABS
2.0000 | ORAL_TABLET | Freq: Every day | ORAL | Status: DC
  Filled 2023-02-05: qty 2

## 2023-02-05 MED ORDER — FENTANYL BOLUS VIA INFUSION: 50.0000 ug | INTRAVENOUS | Status: DC | PRN

## 2023-02-05 MED ORDER — LACTATED RINGERS IV SOLN: INTRAVENOUS | Status: DC | PRN

## 2023-02-05 MED ORDER — GABAPENTIN 300 MG PO CAPS
600.0000 mg | ORAL_CAPSULE | Freq: Three times a day (TID) | ORAL | Status: DC | PRN
  Administered 2023-02-06: 600 mg via ORAL
  Filled 2023-02-05: qty 2

## 2023-02-05 MED ORDER — OXYCODONE HCL 5 MG PO TABS
10.0000 mg | ORAL_TABLET | ORAL | Status: DC | PRN
  Administered 2023-02-05: 10 mg
  Filled 2023-02-05: qty 2

## 2023-02-05 MED ORDER — DULOXETINE HCL 60 MG PO CPEP: 60.0000 mg | ORAL_CAPSULE | Freq: Every day | ORAL | Status: DC

## 2023-02-05 MED ORDER — ROCURONIUM BROMIDE 10 MG/ML (PF) SYRINGE
PREFILLED_SYRINGE | INTRAVENOUS | Status: AC
  Filled 2023-02-05: qty 40

## 2023-02-05 MED ORDER — THROMBIN 5000 UNITS EX SOLR
CUTANEOUS | Status: AC
  Filled 2023-02-05: qty 5000

## 2023-02-05 MED ORDER — THROMBIN 5000 UNITS EX SOLR
OROMUCOSAL | Status: DC | PRN
  Administered 2023-02-05 (×2): 5 mL via TOPICAL

## 2023-02-05 MED ORDER — ROCURONIUM BROMIDE 10 MG/ML (PF) SYRINGE
PREFILLED_SYRINGE | INTRAVENOUS | Status: AC
  Filled 2023-02-05: qty 10

## 2023-02-05 MED ORDER — SCOPOLAMINE 1 MG/3DAYS TD PT72
MEDICATED_PATCH | TRANSDERMAL | Status: AC
  Filled 2023-02-05: qty 1

## 2023-02-05 MED ORDER — DIPHENHYDRAMINE HCL 50 MG/ML IJ SOLN
INTRAMUSCULAR | Status: DC | PRN
  Administered 2023-02-05: 12.5 mg via INTRAVENOUS

## 2023-02-05 MED ORDER — SODIUM CHLORIDE 0.9 % IV SOLN: 250.0000 mL | INTRAVENOUS | Status: DC

## 2023-02-05 MED ORDER — FENTANYL CITRATE (PF) 250 MCG/5ML IJ SOLN
INTRAMUSCULAR | Status: AC
  Filled 2023-02-05: qty 5

## 2023-02-05 MED ORDER — SODIUM CHLORIDE 0.9% FLUSH: 3.0000 mL | INTRAVENOUS | Status: DC | PRN

## 2023-02-05 MED ORDER — SUMATRIPTAN SUCCINATE 50 MG PO TABS: 50.0000 mg | ORAL_TABLET | ORAL | Status: DC | PRN

## 2023-02-05 MED ORDER — PROGESTERONE MICRONIZED 100 MG PO CAPS
100.0000 mg | ORAL_CAPSULE | Freq: Every day | ORAL | Status: DC
  Filled 2023-02-05: qty 1

## 2023-02-05 MED ORDER — SUFENTANIL CITRATE 50 MCG/ML IV SOLN
0.2500 ug/kg/h | INTRAVENOUS | Status: AC
  Administered 2023-02-05 (×3): .2 ug/kg/h via INTRAVENOUS
  Filled 2023-02-05: qty 2

## 2023-02-05 MED ORDER — PHENOL 1.4 % MT LIQD
1.0000 | OROMUCOSAL | Status: DC | PRN
  Filled 2023-02-05: qty 177

## 2023-02-05 MED ORDER — VANCOMYCIN HCL 1000 MG IV SOLR
INTRAVENOUS | Status: DC | PRN
  Administered 2023-02-05: 1000 mg via TOPICAL

## 2023-02-05 MED ORDER — LIDOCAINE-EPINEPHRINE 1 %-1:100000 IJ SOLN
INTRAMUSCULAR | Status: DC | PRN
  Administered 2023-02-05: 15 mL

## 2023-02-05 MED ORDER — ALUM & MAG HYDROXIDE-SIMETH 200-200-20 MG/5ML PO SUSP: 30.0000 mL | Freq: Four times a day (QID) | ORAL | Status: DC | PRN

## 2023-02-05 MED ORDER — METHOCARBAMOL 1000 MG/10ML IJ SOLN: 500.0000 mg | Freq: Four times a day (QID) | INTRAVENOUS | Status: DC | PRN

## 2023-02-05 MED ORDER — GABAPENTIN 250 MG/5ML PO SOLN
600.0000 mg | Freq: Three times a day (TID) | ORAL | Status: DC
  Administered 2023-02-05 – 2023-02-06 (×2): 600 mg
  Filled 2023-02-05 (×4): qty 12

## 2023-02-05 MED ORDER — PHENYLEPHRINE 80 MCG/ML (10ML) SYRINGE FOR IV PUSH (FOR BLOOD PRESSURE SUPPORT)
PREFILLED_SYRINGE | INTRAVENOUS | Status: DC | PRN
  Administered 2023-02-05: 80 ug via INTRAVENOUS

## 2023-02-05 MED ORDER — ROCURONIUM BROMIDE 10 MG/ML (PF) SYRINGE
PREFILLED_SYRINGE | INTRAVENOUS | Status: DC | PRN
  Administered 2023-02-05 (×6): 50 mg via INTRAVENOUS
  Administered 2023-02-05: 100 mg via INTRAVENOUS
  Administered 2023-02-05 (×2): 50 mg via INTRAVENOUS

## 2023-02-05 MED ORDER — SODIUM CHLORIDE 0.9% IV SOLUTION: Freq: Once | INTRAVENOUS | Status: DC

## 2023-02-05 MED ORDER — OXYCODONE HCL 5 MG PO TABS: 5.0000 mg | ORAL_TABLET | ORAL | Status: DC | PRN

## 2023-02-05 MED ORDER — SUFENTANIL CITRATE 50 MCG/ML IV SOLN
0.2500 ug/kg/h | INTRAVENOUS | Status: DC
  Filled 2023-02-05: qty 1

## 2023-02-05 MED ORDER — MIDAZOLAM HCL 2 MG/2ML IJ SOLN
INTRAMUSCULAR | Status: AC
  Filled 2023-02-05: qty 2

## 2023-02-05 MED ORDER — SPIRONOLACTONE 25 MG PO TABS
100.0000 mg | ORAL_TABLET | Freq: Every morning | ORAL | Status: DC
  Administered 2023-02-06: 100 mg
  Filled 2023-02-05: qty 4

## 2023-02-05 MED ORDER — ORAL CARE MOUTH RINSE
15.0000 mL | OROMUCOSAL | Status: DC
  Administered 2023-02-05 – 2023-02-06 (×7): 15 mL via OROMUCOSAL

## 2023-02-05 MED ORDER — LACTATED RINGERS IV SOLN: INTRAVENOUS | Status: DC

## 2023-02-05 MED ORDER — BUPIVACAINE LIPOSOME 1.3 % IJ SUSP
INTRAMUSCULAR | Status: AC
  Filled 2023-02-05: qty 20

## 2023-02-05 MED ORDER — ORAL CARE MOUTH RINSE: 15.0000 mL | Freq: Once | OROMUCOSAL | Status: AC

## 2023-02-05 MED ORDER — LIDOCAINE 2% (20 MG/ML) 5 ML SYRINGE
INTRAMUSCULAR | Status: DC | PRN
  Administered 2023-02-05: 60 mg via INTRAVENOUS

## 2023-02-05 MED ORDER — PANTOPRAZOLE SODIUM 40 MG PO TBEC: 40.0000 mg | DELAYED_RELEASE_TABLET | Freq: Every day | ORAL | Status: DC

## 2023-02-05 MED ORDER — SENNOSIDES 8.8 MG/5ML PO SYRP
10.0000 mL | ORAL_SOLUTION | Freq: Every day | ORAL | Status: DC
  Administered 2023-02-05: 10 mL
  Filled 2023-02-05: qty 10

## 2023-02-05 MED ORDER — OXYCODONE HCL 5 MG PO TABS: 10.0000 mg | ORAL_TABLET | ORAL | Status: DC | PRN

## 2023-02-05 MED ORDER — ACETAMINOPHEN 325 MG PO TABS
650.0000 mg | ORAL_TABLET | ORAL | Status: DC | PRN
  Administered 2023-02-06: 650 mg
  Filled 2023-02-05: qty 2

## 2023-02-05 MED ORDER — BUPIVACAINE-EPINEPHRINE (PF) 0.25% -1:200000 IJ SOLN
INTRAMUSCULAR | Status: AC
  Filled 2023-02-05: qty 30

## 2023-02-05 MED ORDER — BUPIVACAINE-EPINEPHRINE 0.25% -1:200000 IJ SOLN
INTRAMUSCULAR | Status: DC | PRN
  Administered 2023-02-05: 15 mL

## 2023-02-05 MED ORDER — ACETAMINOPHEN 10 MG/ML IV SOLN
INTRAVENOUS | Status: DC | PRN
  Administered 2023-02-05: 1000 mg via INTRAVENOUS

## 2023-02-05 MED ORDER — SODIUM CHLORIDE 0.9 % IR SOLN
Status: DC | PRN
  Administered 2023-02-05: 1000 mL

## 2023-02-05 MED ORDER — PROPOFOL 10 MG/ML IV BOLUS
INTRAVENOUS | Status: AC
  Filled 2023-02-05: qty 20

## 2023-02-05 MED ORDER — TOPIRAMATE 25 MG PO TABS: 100.0000 mg | ORAL_TABLET | Freq: Every morning | ORAL | Status: DC

## 2023-02-05 MED ORDER — DEXAMETHASONE SODIUM PHOSPHATE 10 MG/ML IJ SOLN
INTRAMUSCULAR | Status: AC
  Filled 2023-02-05: qty 1

## 2023-02-05 MED ORDER — PROPOFOL 1000 MG/100ML IV EMUL
0.0000 ug/kg/min | INTRAVENOUS | Status: DC
  Administered 2023-02-05: 30 ug/kg/min via INTRAVENOUS
  Administered 2023-02-06: 25 ug/kg/min via INTRAVENOUS
  Administered 2023-02-06: 30 ug/kg/min via INTRAVENOUS
  Filled 2023-02-05 (×3): qty 100

## 2023-02-05 MED ORDER — PROPOFOL 10 MG/ML IV BOLUS
INTRAVENOUS | Status: DC | PRN
  Administered 2023-02-05: 150 mg via INTRAVENOUS

## 2023-02-05 MED ORDER — BUPIVACAINE LIPOSOME 1.3 % IJ SUSP
INTRAMUSCULAR | Status: DC | PRN
  Administered 2023-02-05: 20 mL

## 2023-02-05 MED ORDER — PHENYLEPHRINE 80 MCG/ML (10ML) SYRINGE FOR IV PUSH (FOR BLOOD PRESSURE SUPPORT)
PREFILLED_SYRINGE | INTRAVENOUS | Status: AC
  Filled 2023-02-05: qty 10

## 2023-02-05 MED ORDER — FENTANYL CITRATE PF 50 MCG/ML IJ SOSY
50.0000 ug | PREFILLED_SYRINGE | INTRAMUSCULAR | Status: DC | PRN
  Administered 2023-02-05: 50 ug via INTRAVENOUS
  Filled 2023-02-05: qty 1

## 2023-02-05 MED ORDER — POLYETHYLENE GLYCOL 3350 17 G PO PACK
17.0000 g | PACK | Freq: Every day | ORAL | Status: DC
  Administered 2023-02-05 – 2023-02-06 (×2): 17 g
  Filled 2023-02-05 (×2): qty 1

## 2023-02-05 MED ORDER — ACETAMINOPHEN 650 MG RE SUPP: 650.0000 mg | RECTAL | Status: DC | PRN

## 2023-02-05 MED ORDER — 0.9 % SODIUM CHLORIDE (POUR BTL) OPTIME
TOPICAL | Status: DC | PRN
  Administered 2023-02-05: 1000 mL

## 2023-02-05 MED ORDER — ESTRADIOL 0.5 MG PO TABS
0.5000 mg | ORAL_TABLET | Freq: Every day | ORAL | Status: DC
  Administered 2023-02-05: 0.5 mg
  Filled 2023-02-05: qty 1

## 2023-02-05 MED ORDER — CHLORHEXIDINE GLUCONATE 0.12 % MT SOLN
15.0000 mL | Freq: Once | OROMUCOSAL | Status: AC
  Administered 2023-02-05: 15 mL via OROMUCOSAL
  Filled 2023-02-05: qty 15

## 2023-02-05 MED ORDER — SODIUM CHLORIDE 0.9% FLUSH
3.0000 mL | Freq: Two times a day (BID) | INTRAVENOUS | Status: DC
  Administered 2023-02-05 – 2023-02-08 (×6): 3 mL via INTRAVENOUS

## 2023-02-05 MED ORDER — ALBUMIN HUMAN 5 % IV SOLN: INTRAVENOUS | Status: DC | PRN

## 2023-02-05 MED ORDER — HYDROMORPHONE HCL 1 MG/ML IJ SOLN
1.0000 mg | INTRAMUSCULAR | Status: DC | PRN
  Administered 2023-02-05 – 2023-02-07 (×4): 1 mg via INTRAVENOUS
  Filled 2023-02-05 (×4): qty 1

## 2023-02-05 MED ORDER — OXYCODONE HCL 5 MG PO TABS
5.0000 mg | ORAL_TABLET | ORAL | Status: DC | PRN
  Administered 2023-02-06: 5 mg
  Filled 2023-02-05: qty 1

## 2023-02-05 MED ORDER — SUFENTANIL CITRATE 50 MCG/ML IV SOLN
0.2500 ug/kg/h | INTRAVENOUS | Status: AC
  Filled 2023-02-05 (×2): qty 2

## 2023-02-05 MED ORDER — SPIRONOLACTONE 25 MG PO TABS: 100.0000 mg | ORAL_TABLET | Freq: Every morning | ORAL | Status: DC

## 2023-02-05 MED ORDER — FENTANYL CITRATE (PF) 250 MCG/5ML IJ SOLN
INTRAMUSCULAR | Status: DC | PRN
  Administered 2023-02-05 (×2): 50 ug via INTRAVENOUS
  Administered 2023-02-05: 100 ug via INTRAVENOUS
  Administered 2023-02-05: 50 ug via INTRAVENOUS

## 2023-02-05 MED ORDER — THROMBIN 20000 UNITS EX SOLR
CUTANEOUS | Status: AC
  Filled 2023-02-05: qty 20000

## 2023-02-05 MED ORDER — MENTHOL 3 MG MT LOZG: 1.0000 | LOZENGE | OROMUCOSAL | Status: DC | PRN

## 2023-02-05 MED ORDER — SODIUM CHLORIDE (PF) 0.9 % IJ SOLN
INTRAMUSCULAR | Status: DC | PRN
  Administered 2023-02-05: 20 mL via INTRAVENOUS

## 2023-02-05 MED ORDER — ACETAMINOPHEN 325 MG PO TABS: 650.0000 mg | ORAL_TABLET | ORAL | Status: DC | PRN

## 2023-02-05 MED ORDER — MIDAZOLAM HCL 2 MG/2ML IJ SOLN
INTRAMUSCULAR | Status: DC | PRN
  Administered 2023-02-05: 2 mg via INTRAVENOUS

## 2023-02-05 SURGICAL SUPPLY — 114 items
ADH SKN CLS APL DERMABOND .7 (GAUZE/BANDAGES/DRESSINGS) ×1
BAG BANDED W/RUBBER/TAPE 36X54 (MISCELLANEOUS) ×2 IMPLANT
BAG COUNTER SPONGE SURGICOUNT (BAG) ×1 IMPLANT
BAG EQP BAND 135X91 W/RBR TAPE (MISCELLANEOUS) ×2
BAG SPNG CNTER NS LX DISP (BAG) ×3
BAND INSRT 18 STRL LF DISP RB (MISCELLANEOUS) ×2
BAND RUBBER #18 3X1/16 STRL (MISCELLANEOUS) IMPLANT
BASKET BONE COLLECTION (BASKET) IMPLANT
BLADE BONE MILL MEDIUM (MISCELLANEOUS) IMPLANT
BUR 14 MATCH 3 (BUR) IMPLANT
BUR CARBIDE MATCH 3.0 (BURR) ×1 IMPLANT
BUR CUTTER 7.0 ROUND (BURR) IMPLANT
BUR MATCHSTICK NEURO 3.0 LAGG (BURR) IMPLANT
BUR MR8 14 BALL 5 (BUR) IMPLANT
BUR PRECISION FLUTE 5.0 (BURR) IMPLANT
BURR 14 MATCH 3 (BUR) ×2
BURR MR8 14 BALL 5 (BUR) ×1
CAGE EXP CATALYFT 9 (Plate) IMPLANT
CAGE INTERBODY PL LG 7X26.5X24 (Cage) IMPLANT
CAGE INTERBODY PL SHT 7X22.5 (Plate) IMPLANT
CNTNR URN SCR LID CUP LEK RST (MISCELLANEOUS) ×1 IMPLANT
CONN SPINAL CLOSED NEX 25 (Connector) ×2 IMPLANT
CONN SPINAL HOR 5/6 DT (Connector) ×2 IMPLANT
CONNECTOR SPINAL CLOSED NEX 25 (Connector) IMPLANT
CONNECTOR SPINAL HOR 5/6 DT (Connector) IMPLANT
CONT SPEC 4OZ STRL OR WHT (MISCELLANEOUS) ×2
COVER BACK TABLE 60X90IN (DRAPES) ×1 IMPLANT
COVER MAYO STAND STRL (DRAPES) ×1 IMPLANT
COVERAGE SUPPORT O-ARM STEALTH (MISCELLANEOUS) ×1 IMPLANT
CROSSLINK X10 45MM (Screw) IMPLANT
DERMABOND ADVANCED .7 DNX12 (GAUZE/BANDAGES/DRESSINGS) ×1 IMPLANT
DRAIN JACKSON RD 7FR 3/32 (WOUND CARE) IMPLANT
DRAPE C-ARM 42X72 X-RAY (DRAPES) IMPLANT
DRAPE LAPAROTOMY 100X72X124 (DRAPES) ×1 IMPLANT
DRAPE MICROSCOPE SLANT 54X150 (MISCELLANEOUS) IMPLANT
DRAPE SHEET LG 3/4 BI-LAMINATE (DRAPES) ×4 IMPLANT
DRAPE SURG 17X23 STRL (DRAPES) ×1 IMPLANT
DRSG AQUACEL AG ADV 3.5X14 (GAUZE/BANDAGES/DRESSINGS) IMPLANT
DRSG OPSITE 4X5.5 SM (GAUZE/BANDAGES/DRESSINGS) ×1 IMPLANT
DRSG OPSITE POSTOP 4X10 (GAUZE/BANDAGES/DRESSINGS) IMPLANT
DRSG TELFA 3X8 NADH STRL (GAUZE/BANDAGES/DRESSINGS) IMPLANT
DURAPREP 26ML APPLICATOR (WOUND CARE) ×1 IMPLANT
ELECT BLADE INSULATED 4IN (ELECTROSURGICAL) ×1
ELECT REM PT RETURN 9FT ADLT (ELECTROSURGICAL) ×1
ELECTRODE BLADE INSULATED 4IN (ELECTROSURGICAL) ×1 IMPLANT
ELECTRODE REM PT RTRN 9FT ADLT (ELECTROSURGICAL) ×1 IMPLANT
EVACUATOR 1/8 PVC DRAIN (DRAIN) IMPLANT
FEE COVERAGE SUPPORT O-ARM (MISCELLANEOUS) ×1 IMPLANT
GAUZE 4X4 16PLY ~~LOC~~+RFID DBL (SPONGE) IMPLANT
GAUZE PAD ABD 8X10 STRL (GAUZE/BANDAGES/DRESSINGS) ×1 IMPLANT
GAUZE SPONGE 4X4 12PLY STRL (GAUZE/BANDAGES/DRESSINGS) ×1 IMPLANT
GLOVE BIO SURGEON STRL SZ7 (GLOVE) ×2 IMPLANT
GLOVE BIOGEL PI IND STRL 7.0 (GLOVE) ×2 IMPLANT
GLOVE BIOGEL PI IND STRL 8 (GLOVE) ×2 IMPLANT
GLOVE ECLIPSE 8.0 STRL XLNG CF (GLOVE) ×2 IMPLANT
GOWN STRL REUS W/ TWL LRG LVL3 (GOWN DISPOSABLE) IMPLANT
GOWN STRL REUS W/ TWL XL LVL3 (GOWN DISPOSABLE) ×2 IMPLANT
GOWN STRL REUS W/TWL 2XL LVL3 (GOWN DISPOSABLE) IMPLANT
GOWN STRL REUS W/TWL LRG LVL3 (GOWN DISPOSABLE)
GOWN STRL REUS W/TWL XL LVL3 (GOWN DISPOSABLE) ×2
GRAFT BN 10X1XDBM MAGNIFUSE (Bone Implant) IMPLANT
GRAFT BONE MAGNIFUSE 1X10CM (Bone Implant) ×2 IMPLANT
HEMOSTAT POWDER KIT SURGIFOAM (HEMOSTASIS) IMPLANT
KIT BASIN OR (CUSTOM PROCEDURE TRAY) ×1 IMPLANT
KIT INFUSE X SMALL 1.4CC (Orthopedic Implant) IMPLANT
KIT POSITION SURG JACKSON T1 (MISCELLANEOUS) ×1 IMPLANT
KIT TURNOVER KIT B (KITS) ×1 IMPLANT
MARKER SKIN DUAL TIP RULER LAB (MISCELLANEOUS) ×1 IMPLANT
MARKER SPHERE PSV REFLC NDI (MISCELLANEOUS) ×5 IMPLANT
MILL BONE PREP (MISCELLANEOUS) IMPLANT
NDL HYPO 21X1.5 ECLIPSE (NEEDLE) ×1 IMPLANT
NDL HYPO 21X1.5 SAFETY (NEEDLE) IMPLANT
NDL HYPO 25X1 1.5 SAFETY (NEEDLE) ×1 IMPLANT
NEEDLE HYPO 21X1.5 ECLIPSE (NEEDLE) ×1 IMPLANT
NEEDLE HYPO 21X1.5 SAFETY (NEEDLE) ×1 IMPLANT
NEEDLE HYPO 25X1 1.5 SAFETY (NEEDLE) ×1 IMPLANT
NS IRRIG 1000ML POUR BTL (IV SOLUTION) ×1 IMPLANT
PACK LAMINECTOMY NEURO (CUSTOM PROCEDURE TRAY) ×1 IMPLANT
PAD ARMBOARD 7.5X6 YLW CONV (MISCELLANEOUS) ×3 IMPLANT
PATTIES SURGICAL .5 X.5 (GAUZE/BANDAGES/DRESSINGS) IMPLANT
PATTIES SURGICAL .5 X1 (DISPOSABLE) IMPLANT
PATTIES SURGICAL 1X1 (DISPOSABLE) IMPLANT
PUTTY GRAFTON DBF 6CC W/DELIVE (Putty) IMPLANT
ROD 5.5MM SPINAL SOLERA (Rod) IMPLANT
ROD DUAL BEND 5.5 TI S 350 (Rod) IMPLANT
SCREW BS CNCMA S T/C 8.5X90 (Screw) IMPLANT
SCREW CONNECTOR SET (Screw) IMPLANT
SCREW PLIF MA SOLERA 5.5X50 (Screw) IMPLANT
SCREW SET SOLERA (Screw) ×24 IMPLANT
SCREW SET SOLERA TI5.5 (Screw) IMPLANT
SCREW SOLERA 45X5.5XMA NS SPNE (Screw) IMPLANT
SCREW SOLERA 45X6.5XMA NS SPNE (Screw) IMPLANT
SCREW SOLERA 5.5X45MM (Screw) ×1 IMPLANT
SCREW SOLERA 6.5X45MM (Screw) ×9 IMPLANT
SCREW SOLERA 6.5X50 (Screw) IMPLANT
SCREW SOLERA 7.5X40 (Screw) IMPLANT
SCREW SOLERA 7.5X45 (Screw) IMPLANT
SCREW SOLERA 7.5X50 (Screw) IMPLANT
SEALER BIPOLAR AQUA 6.0 (INSTRUMENTS) IMPLANT
SPIKE FLUID TRANSFER (MISCELLANEOUS) ×1 IMPLANT
SPONGE SURGIFOAM ABS GEL 100 (HEMOSTASIS) ×1 IMPLANT
SPONGE T-LAP 4X18 ~~LOC~~+RFID (SPONGE) IMPLANT
STAPLER VISISTAT 35W (STAPLE) ×1 IMPLANT
SUT STRATAFIX 1PDS 45CM VIOLET (SUTURE) IMPLANT
SUT VIC AB 0 CT1 27 (SUTURE) ×2
SUT VIC AB 0 CT1 27XBRD ANBCTR (SUTURE) ×1 IMPLANT
SUT VIC AB 2-0 CP2 18 (SUTURE) ×1 IMPLANT
SUT VIC AB 2-0 CT1 18 (SUTURE) IMPLANT
SYR 20ML LL LF (SYRINGE) ×1 IMPLANT
TOWEL GREEN STERILE (TOWEL DISPOSABLE) IMPLANT
TOWEL GREEN STERILE FF (TOWEL DISPOSABLE) IMPLANT
TRAY FOLEY MTR SLVR 16FR STAT (SET/KITS/TRAYS/PACK) ×1 IMPLANT
WATER STERILE IRR 1000ML POUR (IV SOLUTION) ×1 IMPLANT
YANKAUER SUCT BULB TIP NO VENT (SUCTIONS) IMPLANT

## 2023-02-05 NOTE — Progress Notes (Signed)
eLink Physician-Brief Progress Note Patient Name: Rebecca Vargas DOB: [birth year] MRN: [id number]   Date of Service  02/05/2023  HPI/Events of Note  Patient is post-op scoliosis spine surgery and was transferred to the ICU intubated, sedated, and mechanically ventilated, pending a controlled extubation in the AM, PCCM  was consulted for ventilator management.  eICU Interventions  New Patient Evaluation.        Frederik Pear 02/05/2023, 8:51 PM

## 2023-02-05 NOTE — Transfer of Care (Addendum)
Immediate Anesthesia Transfer of Care Note  Patient: Rebecca Vargas  Procedure(s) Performed: Open Thoracic Ten-Pelvis Instrumentation and Fusion; Lumbar One-Sacral One Posterior Column Osteotomies; Lumbar Two-Three, Lumbar Three-Four, Lumbar Four-Five, Lumbar Five-Sacral One Transforaminal Lumbar Interbody Fusions APPLICATION OF CELL SAVER Application of O-Arm  Patient Location: ICU  Anesthesia Type:General  Level of Consciousness: Patient remains intubated per anesthesia plan  Airway & Oxygen Therapy: Patient remains intubated per anesthesia plan and Patient placed on Ventilator (see vital sign flow sheet for setting)  Post-op Assessment: Report given to RN and Post -op Vital signs reviewed and stable  Post vital signs: Reviewed and stable  Last Vitals:  Vitals Value Taken Time  BP 143/96 02/05/23 1919  Temp    Pulse 96 02/05/23 1938  Resp 16 02/05/23 1938  SpO2 98 % 02/05/23 1938  Vitals shown include unvalidated device data.  Last Pain:  Vitals:   02/05/23 0628  TempSrc:   PainSc: 7       Patients Stated Pain Goal: 5 ([id number])  Complications: No notable events documented.

## 2023-02-05 NOTE — Anesthesia Postprocedure Evaluation (Signed)
Anesthesia Post Note  Patient: MORINE LINGLEY  Procedure(s) Performed: Open Thoracic Ten-Pelvis Instrumentation and Fusion; Lumbar One-Sacral One Posterior Column Osteotomies; Lumbar Two-Three, Lumbar Three-Four, Lumbar Four-Five, Lumbar Five-Sacral One Transforaminal Lumbar Interbody Fusions APPLICATION OF CELL SAVER Application of O-Arm     Patient location during evaluation: SICU Anesthesia Type: General Level of consciousness: sedated Pain management: pain level controlled Vital Signs Assessment: post-procedure vital signs reviewed and stable Respiratory status: patient remains intubated per anesthesia plan Cardiovascular status: stable Postop Assessment: no apparent nausea or vomiting Anesthetic complications: no  No notable events documented.  Last Vitals:  Vitals:   02/05/23 1941 02/05/23 2000  BP:  (!) 153/95  Pulse: 91 98  Resp: 18 18  Temp:    SpO2: 97% 98%    Last Pain:  Vitals:   02/05/23 0628  TempSrc:   PainSc: 7                  Yarisa Lynam,W. EDMOND

## 2023-02-05 NOTE — Progress Notes (Signed)
[facility] Progress Note Patient Name: Rebecca Vargas DOB: August 18, 1966 MRN: [id number]   Date of Service  02/05/2023  HPI/Events of Note  Patient is on the ventilator and needs orders for bilateral wrist restraints to prevent self-extubation.  eICU Interventions  Bilateral wrist restraints ordered.        Frederik Pear 02/05/2023, 9:56 PM

## 2023-02-05 NOTE — Anesthesia Procedure Notes (Signed)
Procedure Name: Intubation Date/Time: 02/05/2023 7:45 AM  Performed by: Griffin Dakin, CRNAPre-anesthesia Checklist: Patient identified, Emergency Drugs available, Suction available and Patient being monitored Patient Re-evaluated:Patient Re-evaluated prior to induction Oxygen Delivery Method: Circle system utilized Preoxygenation: Pre-oxygenation with 100% oxygen Induction Type: IV induction Ventilation: Mask ventilation without difficulty and Oral airway inserted - appropriate to patient size Laryngoscope Size: Mac and 4 Grade View: Grade II Tube type: Oral Tube size: 7.0 mm Number of attempts: 1 Airway Equipment and Method: Stylet and Oral airway Placement Confirmation: ETT inserted through vocal cords under direct vision, positive ETCO2 and breath sounds checked- equal and bilateral Secured at: 24 cm Tube secured with: Tape Dental Injury: Teeth and Oropharynx as per pre-operative assessment

## 2023-02-05 NOTE — Anesthesia Preprocedure Evaluation (Signed)
Anesthesia Evaluation  Patient identified by MRN, date of birth, ID band Patient awake    Reviewed: Allergy & Precautions, H&P , NPO status , Patient's Chart, lab work & pertinent test results  History of Anesthesia Complications (+) PONV and history of anesthetic complications  Airway Mallampati: II   Neck ROM: full    Dental   Pulmonary sleep apnea    breath sounds clear to auscultation       Cardiovascular negative cardio ROS  Rhythm:regular Rate:Normal     Neuro/Psych  Headaches PSYCHIATRIC DISORDERS Anxiety Depression     Neuromuscular disease    GI/Hepatic ,GERD  ,,  Endo/Other    Renal/GU      Musculoskeletal  (+) Arthritis ,  Fibromyalgia -  Abdominal   Peds  Hematology  (+) Blood dyscrasia, anemia   Anesthesia Other Findings   Reproductive/Obstetrics                             Anesthesia Physical Anesthesia Plan  ASA: 2  Anesthesia Plan: General   Post-op Pain Management:    Induction: Intravenous  PONV Risk Score and Plan: 4 or greater and Ondansetron, Dexamethasone, Midazolam and Treatment may vary due to age or medical condition  Airway Management Planned: Oral ETT  Additional Equipment: Arterial line  Intra-op Plan:   Post-operative Plan: Extubation in OR  Informed Consent: I have reviewed the patients History and Physical, chart, labs and discussed the procedure including the risks, benefits and alternatives for the proposed anesthesia with the patient or authorized representative who has indicated his/her understanding and acceptance.     Dental advisory given  Plan Discussed with: CRNA, Anesthesiologist and Surgeon  Anesthesia Plan Comments:        Anesthesia Quick Evaluation

## 2023-02-05 NOTE — Consult Note (Signed)
NAME:  Rebecca Vargas, MRN:  [id number], DOB:  Apr 03, 1966, LOS: 0 ADMISSION DATE:  02/05/2023, CONSULTATION DATE:  02/05/23 REFERRING MD:  [place] CHIEF COMPLAINT:  Vent Managent   History of Present Illness:  Rebecca Vargas is a 57 y.o. female who has a PMH as below including chronic low back pain, radiculopathy, degenerative scoliosis. She failed conservative measures and ultimately presented to [facility] 2/29 for elective surgical intervention. She underwent T10-pelvis instrumentation and fusion (op note pending). Per Dr Reatha Armour, surgery was around 9 hours in total and pt was prone the duration; hence, once completed, anesthesia felt she would do best if remained intubated overnight at least.  She was transferred to the neuro ICU and PCCM asked to see in consultation for vent management.  Pertinent  Medical History:  has Cervicalgia; Primary osteoarthritis of right hip; Primary localized osteoarthritis of left hip; Scoliosis; and Degenerative scoliosis in adult patient on their problem list.  Significant Hospital Events: Including procedures, antibiotic start and stop dates in addition to other pertinent events   2/29 elective surgery T10 - pelvis instrumentation and fusion  Interim History / Subjective:  Sedated on propofol.  Objective:  Blood pressure 123/87, pulse 91, temperature 97.7 F (36.5 C), temperature source Oral, resp. rate 18, height '5\' 7"'$  (1.702 m), weight 113.4 kg, SpO2 97 %.    Vent Mode: PRVC FiO2 (%):  [50 %-100 %] 100 % Set Rate:  [12 bmp-18 bmp] 18 bmp Vt Set:  [480 mL-500 mL] 480 mL PEEP:  [8 cmH20] 8 cmH20 Plateau Pressure:  [17 cmH20] 17 cmH20   Intake/Output Summary (Last 24 hours) at 02/05/2023 1947 Last data filed at 02/05/2023 1800 Gross per 24 hour  Intake 5666 ml  Output 1805 ml  Net 3861 ml   Filed Weights   02/05/23 0607  Weight: 113.4 kg    Examination: General: Adult female, resting in bed, in NAD. Neuro: Sedated, not responsive. HEENT: Some  facial edema likely from being prone 9 hours intra-op. Otherwise [place]/AT. Sclerae anicteric. ETT in place. Cardiovascular: RRR, no M/R/G.  Lungs: Respirations even and unlabored.  CTA bilaterally, No W/R/R. Abdomen: BS x 4, soft, NT/ND.  Musculoskeletal: No gross deformities, no edema.  Skin: Intact, warm, no rashes.   Assessment & Plan:   Degenerative Scoliosis - failed conservative measures and ultimately underwent T10 - pelvis instrumentation and fusion 2/29 (Dr. Reatha Armour). Full op note pending but per Dr. Reatha Armour, no issues intra-op. 9 hours total surgery time with roughly 1L EBL (400 returned via cell saver) and pt received 2u PRBC and 1u FFP intra-op. - Post op care per neurosurgery.  Post operative ventilator dependence - 9 hour surgery in prone positioning; therefore, anesthesia felt it best to leave pt intubated overnight. Hx OSA on CPAP. - Full vent support. - Wean in AM for hopeful extubation. - F/u ABG. - Bronchial hygiene. - Resume nocturnal CPAP once extubated.  Hx Anxiety, Fibromyalgia, Chronic pain, Migraines. - Supportive care.   Best practice (evaluated daily):  Diet/type: NPO DVT prophylaxis: SCD GI prophylaxis: PPI Lines: Arterial Line Foley:  Yes, and it is still needed Code Status:  full code Last date of multidisciplinary goals of care discussion: None yet.  Labs   CBC: Recent Labs  Lab 02/02/23 1543 02/05/23 0823 02/05/23 1006 02/05/23 1143 02/05/23 1350 02/05/23 1633 02/05/23 1810  WBC 6.7  --   --   --   --   --   --   HGB 13.1   < >  10.9* 10.9* 9.2* 11.6* 11.2*  HCT 38.6   < > 32.0* 32.0* 27.0* 34.0* 33.0*  MCV 93.5  --   --   --   --   --   --   PLT 271  --   --   --   --   --   --    < > = values in this interval not displayed.    Basic Metabolic Panel: Recent Labs  Lab 02/05/23 1006 02/05/23 1143 02/05/23 1350 02/05/23 1633 02/05/23 1810  NA 138 140 139 139 140  K 3.9 4.3 4.3 4.2 4.0  CL  --   --   --   --  107  GLUCOSE  --   --    --   --  120*  BUN  --   --   --   --  11  CREATININE  --   --   --   --  0.50   GFR: Estimated Creatinine Clearance: 102 mL/min (by C-G formula based on SCr of 0.5 mg/dL). Recent Labs  Lab 02/02/23 1543  WBC 6.7    Liver Function Tests: No results for input(s): "AST", "ALT", "ALKPHOS", "BILITOT", "PROT", "ALBUMIN" in the last 168 hours. No results for input(s): "LIPASE", "AMYLASE" in the last 168 hours. No results for input(s): "AMMONIA" in the last 168 hours.  ABG    Component Value Date/Time   PHART 7.326 (L) 02/05/2023 1633   PCO2ART 41.5 02/05/2023 1633   PO2ART 157 (H) 02/05/2023 1633   HCO3 21.7 02/05/2023 1633   TCO2 23 02/05/2023 1810   ACIDBASEDEF 4.0 (H) 02/05/2023 1633   O2SAT 99 02/05/2023 1633     Coagulation Profile: No results for input(s): "INR", "PROTIME" in the last 168 hours.  Cardiac Enzymes: No results for input(s): "CKTOTAL", "CKMB", "CKMBINDEX", "TROPONINI" in the last 168 hours.  HbA1C: No results found for: "HGBA1C"  CBG: No results for input(s): "GLUCAP" in the last 168 hours.  Review of Systems:   Unable to obtain as pt is encephalopathic.  Past Medical History:  She,  has a past medical history of Acne, Anemia, Anxiety, Arthritis, Dyslipidemia, Family history of adverse reaction to anesthesia, Fibrocystic breast disease in female, Fibromyalgia, GERD (gastroesophageal reflux disease), Hidradenitis, Migraine, Multinodular goiter, PMDD (premenstrual dysphoric disorder), PONV (postoperative nausea and vomiting), and Sleep apnea.   Surgical History:   Past Surgical History:  Procedure Laterality Date   CARPAL TUNNEL RELEASE  2006   CHOLECYSTECTOMY     HERNIA REPAIR     Age 66   TOTAL HIP ARTHROPLASTY Right 02/12/2021   Procedure: TOTAL HIP ARTHROPLASTY ANTERIOR APPROACH;  Surgeon: Melrose Nakayama, MD;  Location: [facility];  Service: Orthopedics;  Laterality: Right;   TOTAL HIP ARTHROPLASTY Left 04/22/2022   Procedure: LEFT TOTAL HIP  ARTHROPLASTY ANTERIOR APPROACH;  Surgeon: Melrose Nakayama, MD;  Location: [facility];  Service: Orthopedics;  Laterality: Left;   WISDOM TOOTH EXTRACTION       Social History:   reports that she has never smoked. She has never used smokeless tobacco. She reports that she does not currently use alcohol. She reports that she does not use drugs.   Family History:  Her family history is not on file.   Allergies Allergies  Allergen Reactions   Amoxicillin Hives     Home Medications  Prior to Admission medications   Medication Sig Start Date End Date Taking? Authorizing Provider  acetaminophen (TYLENOL) 650 MG CR tablet Take 1,300 mg by mouth  every 8 (eight) hours as needed for pain.   Yes [provider]  Cholecalciferol 50 MCG (2000 UT) TABS Take 2,000 Units by mouth at bedtime.   Yes [provider]  diclofenac (VOLTAREN) 75 MG EC tablet Take 75 mg by mouth 2 (two) times daily.   Yes [provider]  DULoxetine (CYMBALTA) 60 MG capsule Take 60 mg by mouth daily.   Yes [provider]  estradiol (ESTRACE) 0.5 MG tablet Take 0.5 mg by mouth at bedtime.   Yes [provider]  gabapentin (NEURONTIN) 300 MG capsule Take 600-900 mg by mouth 3 (three) times daily as needed (pain).   Yes [provider]  Magnesium 500 MG TABS Take 500 mg by mouth in the morning.   Yes [provider]  metFORMIN (GLUCOPHAGE-XR) 500 MG 24 hr tablet Take 500 mg by mouth daily with supper.   Yes [provider]  methocarbamol (ROBAXIN) 500 MG tablet Take 500 mg by mouth every 8 (eight) hours as needed for muscle spasms.   Yes [provider]  omeprazole (PRILOSEC) 40 MG capsule Take 40 mg by mouth in the morning.   Yes [provider]  progesterone (PROMETRIUM) 100 MG capsule Take 100 mg by mouth at bedtime. 03/29/22  Yes [provider]  rizatriptan (MAXALT) 5 MG tablet Take 5 mg by mouth as needed for migraine. May repeat  in 2 hours if needed   Yes [provider]  senna (SENOKOT) 8.6 MG TABS tablet Take 2 tablets by mouth at bedtime.   Yes [provider]  spironolactone (ALDACTONE) 100 MG tablet Take 100 mg by mouth in the morning.   Yes [provider]  topiramate (TOPAMAX) 25 MG tablet Take 100 mg by mouth in the morning.   Yes [provider]  aspirin EC 81 MG tablet Take 1 tablet (81 mg total) by mouth 2 (two) times daily after a meal. For 2 weeks then once a day for 2 weeks for DVT prevention. Patient not taking: Reported on 01/30/2023 04/22/22 04/22/23  Loni Dolly, PA-C  promethazine (PHENERGAN) 12.5 MG tablet Take 1-2 tablets (12.5-25 mg total) by mouth every 6 (six) hours as needed for nausea or vomiting. Patient not taking: Reported on 01/30/2023 04/22/22   Loni Dolly, PA-C  tiZANidine (ZANAFLEX) 4 MG tablet Take 1 tablet (4 mg total) by mouth every 6 (six) hours as needed for muscle spasms. Patient not taking: Reported on 01/30/2023 04/22/22 04/22/23  Loni Dolly, PA-C     Critical care time: 35 min.   Montey Hora, [facility] For pager details, please see AMION or use Epic chat  After 1900, please call [facility] for cross coverage needs 02/05/2023, 7:47 PM

## 2023-02-05 NOTE — H&P (Signed)
Providing Compassionate, Quality Care - Together  NEUROSURGERY HISTORY & PHYSICAL   Rebecca Vargas is an 57 y.o. female.   Chief Complaint: Scoliosis HPI: This is a 57 year old female with a longstanding history of chronic low back pain, with left greater than right lower extremity radiculopathy.  She has a history of degenerative scoliosis with sagittal and coronal imbalance.  MRI, standing scoliosis films and CT scans were obtained which revealed significant coronal deformity of approximately 43 degrees with sagittal imbalance of 20 degrees.  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  She had failed multiple conservative measures and presents today for surgical intervention in the form of a T10-pelvis instrumentation and fusion, L2-S1 TLIF, L1-S1 posterior column osteotomies for scoliosis correction and decompression.  She obtain medical clearance.  She has no further complaints at this time.  Past Medical History:  Diagnosis Date   Acne    Anemia    Anxiety    Arthritis    Dyslipidemia    Family history of adverse reaction to anesthesia    mother- N/V , father- slow to wake up   Fibrocystic breast disease in female    Fibromyalgia    GERD (gastroesophageal reflux disease)    Hidradenitis    Migraine    Multinodular goiter    Pt denies   PMDD (premenstrual dysphoric disorder)    PONV (postoperative nausea and vomiting)    Sleep apnea    has cpap    Past Surgical History:  Procedure Laterality Date   CARPAL TUNNEL RELEASE  2006   CHOLECYSTECTOMY     HERNIA REPAIR     Age 37   TOTAL HIP ARTHROPLASTY Right 02/12/2021   Procedure: TOTAL HIP ARTHROPLASTY ANTERIOR APPROACH;  Surgeon: Melrose Nakayama, MD;  Location: [facility];  Service: Orthopedics;  Laterality: Right;   TOTAL HIP ARTHROPLASTY Left 04/22/2022   Procedure: LEFT TOTAL HIP ARTHROPLASTY ANTERIOR APPROACH;  Surgeon: Melrose Nakayama, MD;  Location: [facility];  Service: Orthopedics;   Laterality: Left;   WISDOM TOOTH EXTRACTION      History reviewed. No pertinent family history. Social History:  reports that she has never smoked. She has never used smokeless tobacco. She reports that she does not currently use alcohol. She reports that she does not use drugs.  Allergies:  Allergies  Allergen Reactions   Amoxicillin Hives    Medications Prior to Admission  Medication Sig Dispense Refill   acetaminophen (TYLENOL) 650 MG CR tablet Take 1,300 mg by mouth every 8 (eight) hours as needed for pain.     Cholecalciferol 50 MCG (2000 UT) TABS Take 2,000 Units by mouth at bedtime.     diclofenac (VOLTAREN) 75 MG EC tablet Take 75 mg by mouth 2 (two) times daily.     DULoxetine (CYMBALTA) 60 MG capsule Take 60 mg by mouth daily.     estradiol (ESTRACE) 0.5 MG tablet Take 0.5 mg by mouth at bedtime.     gabapentin (NEURONTIN) 300 MG capsule Take 600-900 mg by mouth 3 (three) times daily as needed (pain).     Magnesium 500 MG TABS Take 500 mg by mouth in the morning.     metFORMIN (GLUCOPHAGE-XR) 500 MG 24 hr tablet Take 500 mg by mouth daily with supper.     methocarbamol (ROBAXIN) 500 MG tablet Take 500 mg by mouth every 8 (eight) hours as needed for muscle spasms.     omeprazole (PRILOSEC) 40 MG capsule Take  40 mg by mouth in the morning.     progesterone (PROMETRIUM) 100 MG capsule Take 100 mg by mouth at bedtime.     rizatriptan (MAXALT) 5 MG tablet Take 5 mg by mouth as needed for migraine. May repeat in 2 hours if needed     senna (SENOKOT) 8.6 MG TABS tablet Take 2 tablets by mouth at bedtime.     spironolactone (ALDACTONE) 100 MG tablet Take 100 mg by mouth in the morning.     topiramate (TOPAMAX) 25 MG tablet Take 100 mg by mouth in the morning.     aspirin EC 81 MG tablet Take 1 tablet (81 mg total) by mouth 2 (two) times daily after a meal. For 2 weeks then once a day for 2 weeks for DVT prevention. (Patient not taking: Reported on 01/30/2023) 45 tablet 0    promethazine (PHENERGAN) 12.5 MG tablet Take 1-2 tablets (12.5-25 mg total) by mouth every 6 (six) hours as needed for nausea or vomiting. (Patient not taking: Reported on 01/30/2023) 40 tablet 1   tiZANidine (ZANAFLEX) 4 MG tablet Take 1 tablet (4 mg total) by mouth every 6 (six) hours as needed for muscle spasms. (Patient not taking: Reported on 01/30/2023) 40 tablet 1    No results found for this or any previous visit (from the past 48 hour(s)). No results found.  ROS All pertinent positives and negatives are listed in HPI above  Blood pressure 123/87, pulse 76, temperature 97.7 F (36.5 C), temperature source Oral, resp. rate 17, height '5\' 7"'$  (1.702 m), weight 113.4 kg, SpO2 95 %. Physical Exam  Awake alert oriented x 3, no acute distress, nonlabored breathing PERRLA Cranial nerves II through XII intact Speech fluent and appropriate Bilateral upper/lower extremities 5/5 throughout [place] sensation left lower extremity the L4-L5 distributions  Assessment/Plan 57 year old female with  Thoracolumbar sagittal and coronal imbalance, scoliosis and stenosis  -OR today for T10-pelvis instrumentation and fusion, L1-S1 posterior column osteotomies, L2-S1 TLIF for scoliosis correction.  We discussed all risks, benefits and expected outcomes as well as alternatives to treatment.  Informed consent was obtained and witnessed.  She had failed multiple conservative measures, we had extensive discussions going over images, options for treatment and ultimate recommendations of surgical intervention.  Thank you for allowing me to participate in this patient's care.  Please do not hesitate to call with questions or concerns.   Elwin Sleight, [facility] Cell: [phone]

## 2023-02-05 NOTE — Op Note (Signed)
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, chronic back pain, lumbar spondylosis  POSTOP DIAGNOSIS: Same  PROCEDURE: Open T10-pelvis instrumentation, posterolateral arthrodesis for correction of sagittal and coronal imbalance Posterior column osteotomies, L1-2, L2-3, L3-4, L4-5, L5-S1 for correction of sagittal balance L2-3, L3-4, L4-5, L5-S1 transforaminal lumbar interbody fusion Bilateral segmental pedicle screw instrumentation, Medtronic Solera (T10: 6.5 x 45 mm bilaterally, T11: 6.5 x 45 mm bilaterally, T12: 6.5 x 45 mm bilaterally, L1: 6.5 x 45 mm on the left, 5.5 x 45 mm on the right, L2 6.5 x 45 mm on the left, 5.5 mm x 50 mm on the right, L3: 6.5 x 45 mm on the left, 6.5 x 50 mm on the right, L4: 6.5 x 50 mm on the left, 7.5 x 50 mm on the right, L5 7.5 x 45 mm bilaterally, S1 7.5 x 40 mm bilaterally, S2 AI 8.5 x 90 mm bilaterally) Placement of anterior biomechanical devices: Medtronic catalyft expandable interbody L2-3: 7 expanded to approximately 14 mm, L3-4: 7 manage to approximately 14 mm, L4-5: 9 mm expanded to approximately 15 mm, L5-S1 7 mm, expanded to approximately 14 mm Intraoperative use of Stereotactic spinal navigation was utilized throughout the procedure for planning and placement of pedicle screw trajectories (Medtronic Stealth/O-arm) Intraoperative use of microscope for microdissection Intraoperative use of autograft, same incision Intraoperative use of allograft Intraoperative use of BMP  SURGEON: Dr. Pieter Partridge C. Savoy [place], DO  ASSISTANT: Dr. Duffy Rhody, MD; no qualified resident or assistant available  ANESTHESIA: General Endotracheal  EBL: 1000 cc (2Uprbc given, 1 FFP, 400cc returned from cell saver)  SPECIMENS: None  DRAINS: Medium Hemovac in the epidural space  COMPLICATIONS: None  CONDITION: Hemodynamically stable  HISTORY: MICKI QUERY is a 57 y.o. female with a longstanding history of chronic low back pain and left lower extremity radiculopathy.  CT MRI and standing scoliosis films revealed a significant coronal Cobb angle of 42 degrees in the lumbar spine with apex at L2-3 and lateral listhesis at multiple levels.  There is also sagittal imbalance with a 20 degree mismatch between the PI and LL.  She had failed multiple conservative measures and therefore we discussed surgical intervention in the form of a T10-pelvis instrumentation and fusion, L1-S1 decompression, posterior column osteotomies, L2-S1 TLIF for correction of her sagittal and coronal imbalance.  All risks, benefits and expected outcomes were discussed and agreed upon.  Informed consent was obtained and witnessed.  Medical clearance was obtained.  PROCEDURE IN DETAIL: The patient was brought to the operating room. After induction of general anesthesia, the patient was positioned on the operative table in the prone position. All pressure points were meticulously padded. Skin incision was then marked out and prepped and draped in the usual sterile fashion. Physician driven timeout was performed.  Local anesthetic was injected into the planned incision.   Using a 10 blade, midline incision was performed over the T10-sacrum.  Sharp dissection was performed down to the dorsal fascia.  Using Bovie electrocautery, subperiosteal dissection was performed bilaterally to expose the T10 lamina, transverse processes, T10-11 facet joints, similarly at T11-12, T12-L1, L1-L2, L2-L3, L3-L4, L4-L5, L5-S1, S1 and S2 neuroforamina.  We then placed the spinous process clamp on the L3 spinous process and the navigation frame was placed.  We then sterilely draped the field and performed an intraoperative CT for navigation.  This was verified with bony landmarks to have  excellent accuracy.  I then used a high-speed drill to perform pilot holes and a tap that was navigated in order to access  the pedicles bilaterally at T10, T11, T12, L1, L2, L3, L4, L5, S1, S2 AI bilaterally.  Using a ball-tipped probe, each pedicle was palpated and there were bony borders in all directions and a bony floor.  The above listed pedicle screws were placed with appropriate bony purchase.  We then sterilely draped the field and performed post instrumentation CT scan.  All instrumentation appeared in appropriate position except for the left L3 screw appeared to have medial breach therefore this was redirected by removing the pedicle screw, using a high-speed drill creating a new pilot hole, using a 4.5 mm tap to access the pedicle.  Using ball-tipped probe there were bony borders in all directions and a bony floor.  The same screw was then replaced and the new trajectory with appropriate bony purchase.  Attention was then turned to our decompression.  The spinous processes were removed from L1-S1 with a Leksell rongeur.  Autograft was saved and used for later. using the high-speed drill and a series of Leksell rongeurs and Kerrison rongeurs, the bilateral lamina of L1 and inferior articulating process was removed down to the ligamentum flavum.  Then using the high-speed drill the superior articulating process bilaterally was removed down to the ligamentum flavum.  The remaining portion of the superior lamina was removed with Kerrison rongeurs after carefully dissecting the epidural space with Penfield 4.  This completed the bilateral osteotomy at L1-2.  This was then repeated bilaterally at L2-3, L3-4, L4-5, L5-S1.  Then using the ball-tipped probe and Penfield 4, the ligamentum flavum was carefully elevated from the epidural space and thecal sac was identified at L1-2.  Using Kerrison rongeurs to the ligamentum flavum was resected bilaterally in totality from the lateral recess and foramina to expose the traversing nerve roots, common dural tube and exiting nerve roots.  This was repeated at L2-3, L3-4, L4-5 and L5-S1.   Epidural hemostasis was achieved with Surgifoam and bipolar cautery.  There was noted to be significant stenosis in the left lateral recess at L3-4, L4-5, L5-S1.  Attention was then turned to our interbody devices.  Utilizing a right-sided approach for the transforaminal lumbar interbody fusions. L2-3 TLIF: Camden's triangle was identified at L2-3, epidural veins were coagulated with bipolar forceps and cut sharply with microscissors.  Annulus was identified, coagulated with bipolar forceps.  Using 15 blade annulotomy was performed.  We then gently retracted the thecal sac and traversing nerve root medially and annulotomy was widened with Kerrison rongeurs.  Using a series of disc space shavers, a radical discectomy was performed and during this the spine began to have appropriate reduction and derotation.  Using a series of pituitary rongeurs and curettes, disc was removed as well as down to the subchondral bleeding bone along the endplates.  We then selected a trial and use lateral fluoroscopy confirmed the appropriate size.  I then placed a mixture of BMP, autograft into the disc base, followed by gently medially retracting the thecal sac and traversing nerve root and placing the interbody device.  Under lateral fluoroscopy was placed in the L2-3 disc space, and then expanded under live fluoroscopy to the appropriate height, with appropriate bony endplate contact.  We then placed further autograft in the lateral disc base with a mixture of BMP.  The traversing nerve root, common dural tube and exiting nerve root were palpated with a ball-tipped  probe and noted to be appropriately decompressed and pulsatile.  Epidural hemostasis was achieved with Surgifoam.  L3-4 TLIF:Camden's triangle was identified at L3-4, epidural veins were coagulated with bipolar forceps and cut sharply with microscissors.  Annulus was identified, coagulated with bipolar forceps.  Using 15 blade annulotomy was performed.  We then gently  retracted the thecal sac and traversing nerve root medially and annulotomy was widened with Kerrison rongeurs.  Using a series of disc space shavers, a radical discectomy was performed and during this the spine began to have appropriate reduction and derotation.  Using a series of pituitary rongeurs and curettes, disc was removed as well as down to the subchondral bleeding bone along the endplates.  We then selected a trial and use lateral fluoroscopy confirmed the appropriate size.  I then placed a mixture of BMP, autograft into the disc base, followed by gently medially retracting the thecal sac and traversing nerve root and placing the interbody device.  Under lateral fluoroscopy was placed in the L3-4 disc space, and then expanded under live fluoroscopy to the appropriate height, with appropriate bony endplate contact.  We then placed further autograft in the lateral disc base with a mixture of BMP.  The traversing nerve root, common dural tube and exiting nerve root were palpated with a ball-tipped probe and noted to be appropriately decompressed and pulsatile.  Epidural hemostasis was achieved with Surgifoam.  L4-5 TLIF: Camden's triangle was identified at L4-5, epidural veins were coagulated with bipolar forceps and cut sharply with microscissors.  Annulus was identified, coagulated with bipolar forceps.  Using 15 blade annulotomy was performed.  We then gently retracted the thecal sac and traversing nerve root medially and annulotomy was widened with Kerrison rongeurs.  Using a series of disc space shavers, a radical discectomy was performed and during this the spine began to have appropriate reduction and derotation.  Using a series of pituitary rongeurs and curettes, disc was removed as well as down to the subchondral bleeding bone along the endplates.  We then selected a trial and use lateral fluoroscopy confirmed the appropriate size.  I then placed a mixture of BMP, autograft into the disc base,  followed by gently medially retracting the thecal sac and traversing nerve root and placing the interbody device.  Under lateral fluoroscopy was placed in the L4-5 disc space, and then expanded under live fluoroscopy to the appropriate height, with appropriate bony endplate contact.  We then placed further autograft in the lateral disc base with a mixture of BMP.  The traversing nerve root, common dural tube and exiting nerve root were palpated with a ball-tipped probe and noted to be appropriately decompressed and pulsatile.  Epidural hemostasis was achieved with Surgifoam.  L5-S1 TLIF: Camden's triangle was identified at L5-S1, epidural veins were coagulated with bipolar forceps and cut sharply with microscissors.  Annulus was identified, coagulated with bipolar forceps.  Using 15 blade annulotomy was performed.  We then gently retracted the thecal sac and traversing nerve root medially and annulotomy was widened with Kerrison rongeurs.  Using a series of disc space shavers, a radical discectomy was performed and during this the spine began to have appropriate reduction and derotation.  Using a series of pituitary rongeurs and curettes, disc was removed as well as down to the subchondral bleeding bone along the endplates.  We then selected a trial and use lateral fluoroscopy confirmed the appropriate size.  I then placed a mixture of BMP, autograft into the disc base, followed by gently  medially retracting the thecal sac and traversing nerve root and placing the interbody device.  Under lateral fluoroscopy was placed in the L5-S1 disc space, and then expanded under live fluoroscopy to the appropriate height, with appropriate bony endplate contact.  We then placed further autograft in the lateral disc base with a mixture of BMP.  The traversing nerve root, common dural tube and exiting nerve root were palpated with a ball-tipped probe and noted to be appropriately decompressed and pulsatile.  Epidural hemostasis  was achieved with Surgifoam.  At this point the spine appeared significantly derotated from the initial coronal deformity as well as on lateral fluoroscopy significant improvement in the lordosis.  The amount of correction appeared appropriate.  Attention was then turned to placement of our rods.  Pre-bent 5.5 mm titanium rods were selected, measured and cut to the appropriate size.  I then were further contoured to fit appropriately.  These were placed initially along the patient's left side.  Setscrews were placed and not final tightened at this point.  We then contoured the rod for the patient's right side and utilized reduction towers to further reduce the coronal scoliosis in the lumbar spine from L3-L1.  Setscrews were placed and final tightened bilaterally.  We then placed a supplementary third rod on the patient's right side utilizing W connectors which were final tightened to the manufacturer's recommendation.  A cross-link was then placed in the mid lumbar spine and final tightened appropriately to the manufacturer's recommendation.  Using the high-speed drill we then performed decortication along the T10-11 joint, T11-12 joint, T12-L1 joint, the transverse processes at L1, L2, L3, L4, L5.  A mixture of autograft and allograft was then placed in the lateral gutters to complete the posterior lateral fusion from T10-pelvis.  The thecal sac was covered with Gelfoam and the epidural space was noted to be excellently hemostatic after copious irrigation. Deep retractors were taken of the wound, soft tissue hemostasis was achieved with monopolar cautery.  Long-acting anesthetic was injected into the soft tissues bilaterally.  Medium Hemovac was tunneled superiorly and placed in the epidural space.  We then closed the wound in layers.  0 Vicryl sutures were used for muscle reapproximation.  We then utilized a 1 PDS Quill suture in a running fashion to close the fascia.  Vancomycin powder was placed in the wound.   We then used 2-0 and 3-0 Vicryl sutures to close the dermis.  Skin was closed with staples.  Sterile dressing was applied.  At the end of the case all sponge, needle, and instrument counts were correct. The patient was then transferred to the stretcher, remained intubated due to length of surgery and volume status, remained hemodynamically stable, and taken to the neuro ICU in stable hemodynamic condition.

## 2023-02-05 NOTE — Progress Notes (Signed)
Pharmacy Antibiotic Note  Rebecca Vargas is a 57 y.o. female admitted on 02/05/2023 with surgical prophylaxis - drain.  Pharmacy has been consulted for Vanc dosing. Patient has a history of hives with Amoxicillin.  Vancomycin 1500 mg IV Q 12 hrs. Goal AUC 400-550. Expected AUC: 500 SCr used: 0.5  Plan: Vanc 1500 mg IV q12hr while drain is in place Monitor renal function and for drain removal  Height: '5\' 7"'$  (170.2 cm) Weight: 113.4 kg (250 lb) IBW/kg (Calculated) : 61.6  Temp (24hrs), Avg:97.7 F (36.5 C), Min:97.7 F (36.5 C), Max:97.7 F (36.5 C)  Recent Labs  Lab 02/02/23 1543 02/05/23 1810  WBC 6.7  --   CREATININE  --  0.50    Estimated Creatinine Clearance: 102 mL/min (by C-G formula based on SCr of 0.5 mg/dL).    Allergies  Allergen Reactions   Amoxicillin Hives    Thank you for allowing pharmacy to be a part of this patient's care.  Alanda Slim, PharmD, [facility] Clinical Pharmacist Please see AMION for all Pharmacists' Contact Phone Numbers 02/05/2023, 8:01 PM

## 2023-02-05 NOTE — Progress Notes (Signed)
[facility] Progress Note Patient Name: CHRISSIE MCBURNEY DOB: October 25, 1966 MRN: [id number]   Date of Service  02/05/2023  HPI/Events of Note  KUB reviewed.  eICU Interventions  OG tube in good position.        Kerry Kass Hubert Derstine 02/05/2023, 9:15 PM

## 2023-02-06 ENCOUNTER — Encounter (HOSPITAL_COMMUNITY): Payer: Self-pay | Admitting: Neurological Surgery

## 2023-02-06 ENCOUNTER — Inpatient Hospital Stay (HOSPITAL_COMMUNITY): Payer: Commercial Managed Care - HMO

## 2023-02-06 DIAGNOSIS — M4150 Other secondary scoliosis, site unspecified: Secondary | ICD-10-CM

## 2023-02-06 LAB — COMPREHENSIVE METABOLIC PANEL
ALT: 18 U/L (ref 0–44)
AST: 35 U/L (ref 15–41)
Albumin: 3.2 g/dL — ABNORMAL LOW (ref 3.5–5.0)
Alkaline Phosphatase: 79 U/L (ref 38–126)
Anion gap: 10 (ref 5–15)
BUN: 11 mg/dL (ref 6–20)
CO2: 20 mmol/L — ABNORMAL LOW (ref 22–32)
Calcium: 8.2 mg/dL — ABNORMAL LOW (ref 8.9–10.3)
Chloride: 109 mmol/L (ref 98–111)
Creatinine, Ser: 0.49 mg/dL (ref 0.44–1.00)
GFR, Estimated: >60 mL/min (ref >60)
Glucose, Bld: 130 mg/dL — ABNORMAL HIGH (ref 70–99)
Potassium: 3.4 mmol/L — ABNORMAL LOW (ref 3.5–5.1)
Sodium: 139 mmol/L (ref 135–145)
Total Bilirubin: 0.8 mg/dL (ref 0.3–1.2)
Total Protein: 5.5 g/dL — ABNORMAL LOW (ref 6.5–8.1)

## 2023-02-06 LAB — PREPARE FRESH FROZEN PLASMA: Unit division: 0

## 2023-02-06 LAB — TYPE AND SCREEN
ABO/RH(D): O POS
Antibody Screen: NEGATIVE
Unit division: 0
Unit division: 0

## 2023-02-06 LAB — BPAM RBC
Blood Product Expiration Date: 202403282359
Blood Product Expiration Date: 202403292359
ISSUE DATE / TIME: 202402291408
ISSUE DATE / TIME: 202402291408
Unit Type and Rh: 5100
Unit Type and Rh: 5100

## 2023-02-06 LAB — CBC
HCT: 35.7 % — ABNORMAL LOW (ref 36.0–46.0)
Hemoglobin: 12.4 g/dL (ref 12.0–15.0)
MCH: 32.8 pg (ref 26.0–34.0)
MCHC: 34.7 g/dL (ref 30.0–36.0)
MCV: 94.4 fL (ref 80.0–100.0)
Platelets: 154 10*3/uL (ref 150–400)
RBC: 3.78 MIL/uL — ABNORMAL LOW (ref 3.87–5.11)
RDW: 12.7 % (ref 11.5–15.5)
WBC: 8.9 10*3/uL (ref 4.0–10.5)
nRBC: 0 % (ref 0.0–0.2)

## 2023-02-06 LAB — MAGNESIUM: Magnesium: 1.8 mg/dL (ref 1.7–2.4)

## 2023-02-06 LAB — BPAM FFP
Blood Product Expiration Date: 202402292359
ISSUE DATE / TIME: 202402291440
Unit Type and Rh: 600

## 2023-02-06 LAB — PHOSPHORUS: Phosphorus: 4.4 mg/dL (ref 2.5–4.6)

## 2023-02-06 LAB — TRIGLYCERIDES: Triglycerides: 235 mg/dL — ABNORMAL HIGH (ref <150)

## 2023-02-06 MED ORDER — MAGNESIUM SULFATE 2 GM/50ML IV SOLN
2.0000 g | Freq: Once | INTRAVENOUS | Status: AC
  Administered 2023-02-06: 2 g via INTRAVENOUS
  Filled 2023-02-06: qty 50

## 2023-02-06 MED ORDER — GABAPENTIN 250 MG/5ML PO SOLN
600.0000 mg | Freq: Three times a day (TID) | ORAL | Status: DC
  Administered 2023-02-06 – 2023-02-08 (×6): 600 mg via ORAL
  Filled 2023-02-06 (×9): qty 12

## 2023-02-06 MED ORDER — PANTOPRAZOLE SODIUM 40 MG PO TBEC
40.0000 mg | DELAYED_RELEASE_TABLET | Freq: Every day | ORAL | Status: DC
  Administered 2023-02-07 – 2023-02-08 (×2): 40 mg via ORAL
  Filled 2023-02-06 (×2): qty 1

## 2023-02-06 MED ORDER — POLYETHYLENE GLYCOL 3350 17 G PO PACK
17.0000 g | PACK | Freq: Every day | ORAL | Status: DC
  Administered 2023-02-07 – 2023-02-08 (×2): 17 g via ORAL
  Filled 2023-02-06 (×2): qty 1

## 2023-02-06 MED ORDER — METHOCARBAMOL 1000 MG/10ML IJ SOLN: 500.0000 mg | Freq: Four times a day (QID) | INTRAVENOUS | Status: DC | PRN

## 2023-02-06 MED ORDER — ACETAMINOPHEN 650 MG RE SUPP: 650.0000 mg | RECTAL | Status: DC | PRN

## 2023-02-06 MED ORDER — OXYCODONE HCL 5 MG PO TABS: 5.0000 mg | ORAL_TABLET | ORAL | Status: DC | PRN

## 2023-02-06 MED ORDER — SENNA 8.6 MG PO TABS
1.0000 | ORAL_TABLET | Freq: Every day | ORAL | Status: DC
  Administered 2023-02-06 – 2023-02-07 (×2): 8.6 mg via ORAL
  Filled 2023-02-06 (×2): qty 1

## 2023-02-06 MED ORDER — POTASSIUM CHLORIDE 20 MEQ PO PACK
40.0000 meq | PACK | Freq: Once | ORAL | Status: AC
  Administered 2023-02-06: 40 meq
  Filled 2023-02-06: qty 2

## 2023-02-06 MED ORDER — ESTRADIOL 0.5 MG PO TABS
0.5000 mg | ORAL_TABLET | Freq: Every day | ORAL | Status: DC
  Administered 2023-02-06 – 2023-02-07 (×2): 0.5 mg via ORAL
  Filled 2023-02-06 (×3): qty 1

## 2023-02-06 MED ORDER — DOCUSATE SODIUM 100 MG PO CAPS
100.0000 mg | ORAL_CAPSULE | Freq: Two times a day (BID) | ORAL | Status: DC
  Administered 2023-02-06 – 2023-02-08 (×4): 100 mg via ORAL
  Filled 2023-02-06 (×4): qty 1

## 2023-02-06 MED ORDER — SPIRONOLACTONE 100 MG PO TABS
100.0000 mg | ORAL_TABLET | Freq: Every morning | ORAL | Status: DC
  Administered 2023-02-07 – 2023-02-08 (×2): 100 mg via ORAL
  Filled 2023-02-06: qty 1
  Filled 2023-02-06: qty 4

## 2023-02-06 MED ORDER — METHOCARBAMOL 500 MG PO TABS
500.0000 mg | ORAL_TABLET | Freq: Four times a day (QID) | ORAL | Status: DC | PRN
  Administered 2023-02-06 – 2023-02-07 (×4): 500 mg via ORAL
  Filled 2023-02-06 (×5): qty 1

## 2023-02-06 MED ORDER — SUMATRIPTAN SUCCINATE 50 MG PO TABS
50.0000 mg | ORAL_TABLET | ORAL | Status: DC | PRN
  Administered 2023-02-06 – 2023-02-07 (×2): 50 mg via ORAL
  Filled 2023-02-06 (×3): qty 1

## 2023-02-06 MED ORDER — POTASSIUM CHLORIDE 20 MEQ PO PACK: 40.0000 meq | PACK | Freq: Once | ORAL | Status: DC

## 2023-02-06 MED ORDER — ACETAMINOPHEN 325 MG PO TABS
650.0000 mg | ORAL_TABLET | ORAL | Status: DC | PRN
  Administered 2023-02-06: 650 mg via ORAL
  Filled 2023-02-06 (×2): qty 2

## 2023-02-06 MED ORDER — HEPARIN SODIUM (PORCINE) 5000 UNIT/ML IJ SOLN
5000.0000 [IU] | Freq: Two times a day (BID) | INTRAMUSCULAR | Status: DC
  Administered 2023-02-07 – 2023-02-08 (×3): 5000 [IU] via SUBCUTANEOUS
  Filled 2023-02-06 (×3): qty 1

## 2023-02-06 MED ORDER — OXYCODONE HCL 5 MG PO TABS
10.0000 mg | ORAL_TABLET | ORAL | Status: DC | PRN
  Administered 2023-02-06 – 2023-02-08 (×9): 10 mg via ORAL
  Filled 2023-02-06 (×10): qty 2

## 2023-02-06 MED ORDER — TOPIRAMATE 100 MG PO TABS
100.0000 mg | ORAL_TABLET | Freq: Every morning | ORAL | Status: DC
  Administered 2023-02-07 – 2023-02-08 (×2): 100 mg via ORAL
  Filled 2023-02-06: qty 1
  Filled 2023-02-06: qty 4

## 2023-02-06 NOTE — Evaluation (Signed)
Occupational Therapy Evaluation Patient Details Name: Rebecca Vargas MRN: [id number] DOB: 04/16/1966 Today's Date: 02/06/2023   History of Present Illness Pt is a 57 yo female presenting 2/29 for T10-pelvis instrumentation and fusion, L2-S1 TLIF, L1-S1 posterior column osteotomies for scoliosis correction and decompression. PMH includes: anxiety, arthritis, GERD, migraine, and bilateral THA.   Clinical Impression   Pt currently mod assist +2 for safety during bed mobility with min assist +2 for sit to stand and simulated toilet transfers with bilateral hand held assist Mod to max assist was needed for simulated LB selfcare secondary to back precautions.  She was slightly limited in activity secondary to increased pain 7-8/10 and some nausea.  Oxygen stable at 95% on 3.5Ls nasal cannula with HR elevating from low 100s sitting up to 125 BPM with standing and transfer.  BP at 131/93 sitting EOB.  Feel she will benefit from acute care OT at this time to help increase activity tolerance, safety, and adherence to back precautions during basic ADL tasks.  Recommend transition to [facility] at discharge.  She reports having 24 hr supervision from her spouse at home.       Recommendations for follow up therapy are one component of a multi-disciplinary discharge planning process, led by the attending physician.  Recommendations may be updated based on patient status, additional functional criteria and insurance authorization.   Follow Up Recommendations  Home health OT     Assistance Recommended at Discharge Frequent or constant Supervision/Assistance  Patient can return home with the following A little help with walking and/or transfers;A little help with bathing/dressing/bathroom;Assistance with cooking/housework;Assist for transportation;Help with stairs or ramp for entrance    Functional Status Assessment  Patient has had a recent decline in their functional status and demonstrates the ability to make  significant improvements in function in a reasonable and predictable amount of time.  Equipment Recommendations  None recommended by OT       Precautions / Restrictions Precautions Precautions: Fall;Back Precaution Booklet Issued: No Precaution Comments: verbally educated, will provide handout next visit Restrictions Weight Bearing Restrictions: No      Mobility Bed Mobility Overal bed mobility: Needs Assistance Bed Mobility: Rolling, Sidelying to Sit Rolling: Min assist, +2 for safety/equipment (HOB up around 25 degrees and use of bed rail) Sidelying to sit: Mod assist, HOB elevated, +2 for safety/equipment (HOB up around 25 degrees and use of bed rail)       General bed mobility comments: Educated pt on log roll technique for adherence to back precautions.  She needed min assist for bringing LEs off of the bed and mod assist for transition to sitting.    Transfers Overall transfer level: Needs assistance Equipment used: 2 person hand held assist Transfers: Sit to/from Stand, Bed to chair/wheelchair/BSC Sit to Stand: Min assist, +2 safety/equipment     Step pivot transfers: Min assist, +2 safety/equipment     General transfer comment: Bilateral hand held assist for transfer.  Will benefit from RW next visit.      Balance Overall balance assessment: Needs assistance Sitting-balance support: Feet supported Sitting balance-Leahy Scale: Fair     Standing balance support: Bilateral upper extremity supported, During functional activity Standing balance-Leahy Scale: Poor Standing balance comment: Pt needs BUE support for mobility                           ADL either performed or assessed with clinical judgement   ADL Overall ADL's : Needs  assistance/impaired Eating/Feeding: Independent;Sitting Eating/Feeding Details (indicate cue type and reason): simulated Grooming: Wash/dry face;Set up;Sitting   Upper Body Bathing: Supervision/ safety;Sitting Upper Body  Bathing Details (indicate cue type and reason): simulated Lower Body Bathing: Minimal assistance;Sit to/from stand;+2 for safety/equipment;+2 for physical assistance Lower Body Bathing Details (indicate cue type and reason): simulated Upper Body Dressing : Set up;Sitting Upper Body Dressing Details (indicate cue type and reason): simulated Lower Body Dressing: Maximal assistance;+2 for safety/equipment;+2 for physical assistance Lower Body Dressing Details (indicate cue type and reason): min +2 for sit to stand, max assist for actual dressing secondary to back precautions Toilet Transfer: Minimal assistance;+2 for safety/equipment;+2 for physical assistance;Stand-pivot Toilet Transfer Details (indicate cue type and reason): simulated to recliner         Functional mobility during ADLs: Minimal assistance;+2 for physical assistance (bilateral hand held assist) General ADL Comments: Pt's BP in sitting 131/99.  Oxygen sats at 95% on 3.5 Ls nasal cannula.  HR elevating from low 100s in sitting up to 125 with standing and transfer stand pivot to the recliner.  Pt with slight nausea and increased pain with movement up to 7-8/10.  She reports having a shower seat at home and 3:1 as well as RW and rollator.  Spouse will be able to provide assist at discharge as needed.  Will need education on AE next visit as well as continuation on back precautions.     Vision Baseline Vision/History: 1 Wears glasses Ability to See in Adequate Light: 0 Adequate Patient Visual Report: No change from baseline Vision Assessment?: No apparent visual deficits     Perception  Not tested   Praxis  Not tested    Pertinent Vitals/Pain Pain Assessment Pain Assessment: 0-10 Pain Score: 7  Pain Location: back Pain Descriptors / Indicators: Discomfort Pain Intervention(s): Limited activity within patient's tolerance, Premedicated before session, Repositioned     Hand Dominance Right   Extremity/Trunk Assessment  Upper Extremity Assessment Upper Extremity Assessment: Overall WFL for tasks assessed (not formally assessed secondary back precautions pt pt with no report of shoulder or arm issues previously)   Lower Extremity Assessment Lower Extremity Assessment: Defer to PT evaluation   Cervical / Trunk Assessment Cervical / Trunk Assessment: Back Surgery   Communication Communication Communication: No difficulties   Cognition Arousal/Alertness: Awake/alert Behavior During Therapy: WFL for tasks assessed/performed Overall Cognitive Status: Within Functional Limits for tasks assessed                                                  Home Living Family/patient expects to be discharged to:: Private residence Living Arrangements: Spouse/significant other Available Help at Discharge: Family;Available 24 hours/day Type of Home: House Home Access: Level entry     Home Layout: One level     Bathroom Shower/Tub: Teacher, early years/pre: Standard Bathroom Accessibility: Yes   Home Equipment: Conservation officer, nature (2 wheels);Cane - quad;Shower seat;BSC/3in1;Rollator (4 wheels)          Prior Functioning/Environment Prior Level of Function : Independent/Modified Independent             Mobility Comments: Used rollator on trips but in the house no assistive device.          OT Problem List: Decreased strength;Decreased activity tolerance;Impaired balance (sitting and/or standing);Pain;Decreased knowledge of use of DME or AE;Decreased knowledge of precautions  OT Treatment/Interventions: Self-care/ADL training;Balance training;Therapeutic activities;DME and/or AE instruction;Patient/family education    OT Goals(Current goals can be found in the care plan section) Acute Rehab OT Goals Patient Stated Goal: Pt wants to get back to walking. OT Goal Formulation: With patient Time For Goal Achievement: 02/20/23 Potential to Achieve Goals: Good  OT Frequency:  Min 2X/week    Co-evaluation PT/OT/SLP Co-Evaluation/Treatment: Yes     OT goals addressed during session: ADL's and self-care      AM-PAC OT "6 Clicks" Daily Activity     Outcome Measure Help from another person eating meals?: None Help from another person taking care of personal grooming?: A Little Help from another person toileting, which includes using toliet, bedpan, or urinal?: A Little Help from another person bathing (including washing, rinsing, drying)?: A Little Help from another person to put on and taking off regular upper body clothing?: A Little Help from another person to put on and taking off regular lower body clothing?: A Lot 6 Click Score: 18   End of Session Equipment Utilized During Treatment: Oxygen Nurse Communication: Mobility status  Activity Tolerance: Other (comment);Patient limited by pain (nausea) Patient left: in chair;with call bell/phone within reach;with chair alarm set  OT Visit Diagnosis: Unsteadiness on feet (R26.81);Other abnormalities of gait and mobility (R26.89);Muscle weakness (generalized) (M62.81);Pain Pain - part of body:  (back)                Time: [id number] OT Time Calculation (min): 26 min Charges:  OT General Charges $OT Visit: 1 Visit OT Evaluation $OT Eval Moderate Complexity: Hilda, OTR/L [place]  Office [phone] 02/06/2023

## 2023-02-06 NOTE — Consult Note (Signed)
NAME:  Rebecca Vargas, MRN:  [id number], DOB:  06-09-1966, LOS: 1 ADMISSION DATE:  02/05/2023, CONSULTATION DATE:  02/05/23 REFERRING MD:  [place] CHIEF COMPLAINT:  Vent Managent   History of Present Illness:  Rebecca Vargas is a 57 y.o. female who has a PMH as below including chronic low back pain, radiculopathy, degenerative scoliosis. She failed conservative measures and ultimately presented to [facility] 2/29 for elective surgical intervention. She underwent T10-pelvis instrumentation and fusion (op note pending). Per Dr Reatha Armour, surgery was around 9 hours in total and pt was prone the duration; hence, once completed, anesthesia felt she would do best if remained intubated overnight at least.  She was transferred to the neuro ICU and PCCM asked to see in consultation for vent management.  Pertinent  Medical History:  has Cervicalgia; Primary osteoarthritis of right hip; Primary localized osteoarthritis of left hip; Scoliosis; Degenerative scoliosis in adult patient; and Acute respiratory failure with hypoxemia ([place]) on their problem list.  Significant Hospital Events: Including procedures, antibiotic start and stop dates in addition to other pertinent events   2/29 elective surgery T10 - pelvis instrumentation and fusion  Interim History / Subjective:   No acute events overnight Tolerated SBT this morning  Objective:  Blood pressure (!) 131/90, pulse 89, temperature 98.8 F (37.1 C), temperature source Oral, resp. rate 12, height '5\' 7"'$  (1.702 m), weight 113.4 kg, SpO2 96 %.    Vent Mode: PSV;CPAP FiO2 (%):  [36 %-100 %] 36 % Set Rate:  [12 bmp-20 bmp] 20 bmp Vt Set:  [490 mL-500 mL] 490 mL PEEP:  [5 cmH20-8 cmH20] 5 cmH20 Pressure Support:  [5 cmH20] 5 cmH20 Plateau Pressure:  [15 cmH20-21 cmH20] 21 cmH20   Intake/Output Summary (Last 24 hours) at 02/06/2023 1724 Last data filed at 02/06/2023 1700 Gross per 24 hour  Intake 1728.15 ml  Output 2120 ml  Net -391.85 ml   Filed Weights    02/05/23 0607  Weight: 113.4 kg    Examination: General: Adult female, resting in bed, in NAD, intubated Neuro: following commands, moving all extremities HEENT: Some facial edema. Dooms/AT. Sclerae anicteric. ETT in place. Cardiovascular: RRR, no M/R/G.  Lungs: Respirations even and unlabored.  CTA bilaterally, No W/R/R. Abdomen: BS x 4, soft, NT/ND.  Musculoskeletal: No gross deformities, no edema.  Skin: Intact, warm, no rashes.   Assessment & Plan:   Degenerative Scoliosis - failed conservative measures and ultimately underwent T10 - pelvis instrumentation and fusion 2/29 (Dr. Reatha Armour). 9 hours total surgery time with roughly 1L EBL (400 returned via cell saver) and pt received 2u PRBC and 1u FFP intra-op. - Post op care per neurosurgery.  Post operative ventilator dependence - 9 hour surgery in prone positioning; therefore, anesthesia felt it best to leave pt intubated overnight. Hx OSA on CPAP. - Extubate today - Bronchial hygiene. - Resume nocturnal CPAP once extubated.  Hx Anxiety, Fibromyalgia, Chronic pain, Migraines. - Supportive care.  Hypokalemia - replete  Best practice (evaluated daily):  Diet/type: Regular consistency (see orders) DVT prophylaxis: SCD GI prophylaxis: PPI Lines: Arterial Line and No longer needed.  Order written to d/c  Foley:  Yes, and it is no longer needed Code Status:  full code Last date of multidisciplinary goals of care discussion: None yet.  Labs   CBC: Recent Labs  Lab 02/02/23 1543 02/05/23 0823 02/05/23 1350 02/05/23 1633 02/05/23 1810 02/05/23 2022 02/06/23 0607  WBC 6.7  --   --   --   --   --  8.9  HGB 13.1   < > 9.2* 11.6* 11.2* 11.9* 12.4  HCT 38.6   < > 27.0* 34.0* 33.0* 35.0* 35.7*  MCV 93.5  --   --   --   --   --  94.4  PLT 271  --   --   --   --   --  154   < > = values in this interval not displayed.    Basic Metabolic Panel: Recent Labs  Lab 02/05/23 1350 02/05/23 1633 02/05/23 1810 02/05/23 2022  02/06/23 0607  NA 139 139 140 139 139  K 4.3 4.2 4.0 3.9 3.4*  CL  --   --  107  --  109  CO2  --   --   --   --  20*  GLUCOSE  --   --  120*  --  130*  BUN  --   --  11  --  11  CREATININE  --   --  0.50  --  0.49  CALCIUM  --   --   --   --  8.2*  MG  --   --   --   --  1.8  PHOS  --   --   --   --  4.4   GFR: Estimated Creatinine Clearance: 102 mL/min (by C-G formula based on SCr of 0.49 mg/dL). Recent Labs  Lab 02/02/23 1543 02/06/23 0607  WBC 6.7 8.9    Liver Function Tests: Recent Labs  Lab 02/06/23 0607  AST 35  ALT 18  ALKPHOS 79  BILITOT 0.8  PROT 5.5*  ALBUMIN 3.2*   No results for input(s): "LIPASE", "AMYLASE" in the last 168 hours. No results for input(s): "AMMONIA" in the last 168 hours.  ABG    Component Value Date/Time   PHART 7.212 (L) 02/05/2023 2022   PCO2ART 57.5 (H) 02/05/2023 2022   PO2ART 174 (H) 02/05/2023 2022   HCO3 23.2 02/05/2023 2022   TCO2 25 02/05/2023 2022   ACIDBASEDEF 5.0 (H) 02/05/2023 2022   O2SAT 99 02/05/2023 2022     Coagulation Profile: No results for input(s): "INR", "PROTIME" in the last 168 hours.  Cardiac Enzymes: No results for input(s): "CKTOTAL", "CKMB", "CKMBINDEX", "TROPONINI" in the last 168 hours.  HbA1C: No results found for: "HGBA1C"  CBG: No results for input(s): "GLUCAP" in the last 168 hours.   Critical care time: 45 min.   Freda Jackson, MD [facility] Office: [phone]   See Amion for personal pager PCCM on call pager [phone] until 7pm. Please call Elink 7p-7a. [phone]

## 2023-02-06 NOTE — Progress Notes (Signed)
Pharmacy Electrolyte Replacement  Recent Labs:  Recent Labs    02/06/23 0607  K 3.4*  MG 1.8  PHOS 4.4  CREATININE 0.49    Low Critical Values (K </= 2.5, Phos </= 1, Mg </= 1) Present: None  Plan: Replace with 46mq Kcl and '2mg'$  of mag. Recheck in AM.   HEsmeralda Vargas PharmD, BCCCP

## 2023-02-06 NOTE — Evaluation (Signed)
Physical Therapy Evaluation Patient Details Name: Rebecca Vargas MRN: [id number] DOB: [birth year] Today's Date: 02/06/2023  History of Present Illness  Pt is a 57 yo female presenting 2/29 for T10-pelvis instrumentation and fusion, L2-S1 TLIF, L1-S1 posterior column osteotomies for scoliosis correction and decompression. PMH includes: anxiety, arthritis, GERD, migraine, and bilateral THA.   Clinical Impression  Pt in bed upon arrival of PT, agreeable to evaluation at this time. Prior to admission the pt was ambulating without use of DME, but reports recently limited exercise and activity. The pt now presents with limitations in functional mobility, strength, power, endurance, and stability due to above dx, and will continue to benefit from skilled PT to address these deficits. The pt required min-modA to complete bed mobility and minA of 2 to rise to standing and complete pivotal steps to recliner. The pt did report dizziness with upright positioning and mobility, but VSS with activity. Will continue to benefit from skilled PT acutely to progress endurance and allow for greater independence with mobility prior to anticipated return home.         Recommendations for follow up therapy are one component of a multi-disciplinary discharge planning process, led by the attending physician.  Recommendations may be updated based on patient status, additional functional criteria and insurance authorization.  Follow Up Recommendations Home health PT      Assistance Recommended at Discharge Frequent or constant Supervision/Assistance  Patient can return home with the following  A little help with walking and/or transfers;A little help with bathing/dressing/bathroom;Assistance with cooking/housework;Assist for transportation    Equipment Recommendations None recommended by PT  Recommendations for Other Services       Functional Status Assessment Patient has had a recent decline in their functional status  and demonstrates the ability to make significant improvements in function in a reasonable and predictable amount of time.     Precautions / Restrictions Precautions Precautions: Fall;Back Precaution Booklet Issued: No Precaution Comments: verbally educated, will provide handout next visit Restrictions Weight Bearing Restrictions: No      Mobility  Bed Mobility Overal bed mobility: Needs Assistance Bed Mobility: Rolling, Sidelying to Sit Rolling: Min assist, +2 for safety/equipment (HOB at 25 deg`) Sidelying to sit: Mod assist, HOB elevated, +2 for safety/equipment (HOB up around 25 degrees and use of bed rail)       General bed mobility comments: Educated pt on log roll technique for adherence to back precautions.  She needed min assist for bringing LEs off of the bed and mod assist for transition to sitting.    Transfers Overall transfer level: Needs assistance Equipment used: 2 person hand held assist Transfers: Sit to/from Stand, Bed to chair/wheelchair/BSC Sit to Stand: Min assist, +2 safety/equipment   Step pivot transfers: Min assist, +2 safety/equipment       General transfer comment: Bilateral hand held assist for transfer.  Will benefit from RW next visit.    Ambulation/Gait               General Gait Details: deferred due to fatigue      Balance Overall balance assessment: Needs assistance Sitting-balance support: Feet supported Sitting balance-Leahy Scale: Fair Sitting balance - Comments: stable statically   Standing balance support: Bilateral upper extremity supported, During functional activity Standing balance-Leahy Scale: Poor Standing balance comment: Pt needs BUE support for mobility  Pertinent Vitals/Pain Pain Assessment Pain Assessment: 0-10 Pain Score: 7  Pain Location: back Pain Descriptors / Indicators: Discomfort Pain Intervention(s): Limited activity within patient's tolerance, Monitored  during session, Repositioned, Premedicated before session    [place] expects to be discharged to:: Private residence Living Arrangements: Spouse/significant other Available Help at Discharge: Family;Available 24 hours/day Type of Home: House Home Access: Level entry       Home Layout: One level Home Equipment: Conservation officer, nature (2 wheels);Cane - quad;Shower seat;BSC/3in1;Rollator (4 wheels)      Prior Function Prior Level of Function : Independent/Modified Independent             Mobility Comments: Used rollator on trips but in the house no assistive device.       Hand Dominance   Dominant Hand: Right    Extremity/Trunk Assessment   Upper Extremity Assessment Upper Extremity Assessment: Overall WFL for tasks assessed (not formally assessed secondary back precautions pt pt with no report of shoulder or arm issues previously)    Lower Extremity Assessment Lower Extremity Assessment: Generalized weakness (limited endurance but strong to MMT)    Cervical / Trunk Assessment Cervical / Trunk Assessment: Back Surgery  Communication   Communication: No difficulties  Cognition Arousal/Alertness: Awake/alert Behavior During Therapy: WFL for tasks assessed/performed Overall Cognitive Status: Within Functional Limits for tasks assessed                                          General Comments General comments (skin integrity, edema, etc.): VSS, pt reports dizziness wiht standing and ambulation        Assessment/Plan    PT Assessment Patient needs continued PT services  PT Problem List Decreased strength;Decreased activity tolerance;Decreased balance;Decreased mobility       PT Treatment Interventions DME instruction;Gait training;Stair training;Functional mobility training;Therapeutic activities;Therapeutic exercise;Balance training;Patient/family education    PT Goals (Current goals can be found in the Care Plan section)  Acute  Rehab PT Goals Patient Stated Goal: be able to walk her dogs 1 mile PT Goal Formulation: With patient Time For Goal Achievement: 02/20/23 Potential to Achieve Goals: Good    Frequency Min 3X/week     Co-evaluation PT/OT/SLP Co-Evaluation/Treatment: Yes Reason for Co-Treatment: Complexity of the patient's impairments (multi-system involvement);For patient/therapist safety;To address functional/ADL transfers PT goals addressed during session: Mobility/safety with mobility;Balance;Strengthening/ROM OT goals addressed during session: ADL's and self-care       AM-PAC PT "6 Clicks" Mobility  Outcome Measure Help needed turning from your back to your side while in a flat bed without using bedrails?: A Little Help needed moving from lying on your back to sitting on the side of a flat bed without using bedrails?: A Little Help needed moving to and from a bed to a chair (including a wheelchair)?: A Lot Help needed standing up from a chair using your arms (e.g., wheelchair or bedside chair)?: A Lot Help needed to walk in hospital room?: A Lot Help needed climbing 3-5 steps with a railing? : Total 6 Click Score: 13    End of Session   Activity Tolerance: Patient tolerated treatment well;Patient limited by fatigue Patient left: in chair;with call bell/phone within reach;with chair alarm set Nurse Communication: Mobility status PT Visit Diagnosis: Other abnormalities of gait and mobility (R26.89);Muscle weakness (generalized) (M62.81)    Time: [id number] PT Time Calculation (min) (ACUTE ONLY): 26 min   Charges:  PT Evaluation $PT Eval Low Complexity: 1 Low          West Carbo, PT, DPT   Acute Rehabilitation Department Office [place] Communication Preferred  Sandra Cockayne 02/06/2023, 1:55 PM

## 2023-02-06 NOTE — Progress Notes (Signed)
   Providing Compassionate, Quality Care - Together  NEUROSURGERY PROGRESS NOTE   S: No issues overnight.  Remains intubated  O: EXAM:  BP 117/80   Pulse 90   Temp 98.8 F (37.1 C) (Oral)   Resp 11   Ht '5\' 7"'$  (1.702 m)   Wt 113.4 kg   SpO2 98%   BMI 39.16 kg/m   Intubated, sedated, opening eyes spontaneously, attempting to mouth words PERRL Face symmetric 5/5 BUE/BLE  Hemovac in place  ASSESSMENT:  57 y.o. female with   Thoracolumbar scoliosis with stenosis, sagittal and coronal imbalance  -Status post T10-pelvis fusion, L1-S1 decompression, L2-S1 TLIF  PLAN: -Overall doing well, labs are stable this a.m. -Continue supportive care, appreciate weaning off vent today per CCM -PT/OT once off vent -Will start subcu DVT prophylaxis tomorrow    Thank you for allowing me to participate in this patient's care.  Please do not hesitate to call with questions or concerns.   Elwin Sleight, [facility] Cell: [phone]

## 2023-02-06 NOTE — Procedures (Signed)
Extubation Procedure Note  Patient Details:   Name: Rebecca Vargas DOB: 1965-12-15 MRN: [id number]   Airway Documentation:    Vent end date: 02/06/23 Vent end time: 1050   Evaluation  O2 sats: stable throughout Complications: No apparent complications Patient did tolerate procedure well. Bilateral Breath Sounds: Clear   Yes  Patient extubated per order to 4L [place] with no apparent complications. Positive cuff leak was noted prior to extubation. Patient is alert and oriented, has strong cough, and is able to weakly speak. Vitals are stable. RT will continue to monitor.   Rebecca Vargas 02/06/2023, 10:58 AM

## 2023-02-06 NOTE — Progress Notes (Signed)
125 mL fentanyl wasted in stericycle with Montez Hageman RN

## 2023-02-07 DIAGNOSIS — M4150 Other secondary scoliosis, site unspecified: Secondary | ICD-10-CM | POA: Diagnosis not present

## 2023-02-07 LAB — CBC
HCT: 32 % — ABNORMAL LOW (ref 36.0–46.0)
Hemoglobin: 11.1 g/dL — ABNORMAL LOW (ref 12.0–15.0)
MCH: 32.8 pg (ref 26.0–34.0)
MCHC: 34.7 g/dL (ref 30.0–36.0)
MCV: 94.7 fL (ref 80.0–100.0)
Platelets: 120 10*3/uL — ABNORMAL LOW (ref 150–400)
RBC: 3.38 MIL/uL — ABNORMAL LOW (ref 3.87–5.11)
RDW: 12.4 % (ref 11.5–15.5)
WBC: 9.1 10*3/uL (ref 4.0–10.5)
nRBC: 0 % (ref 0.0–0.2)

## 2023-02-07 LAB — COMPREHENSIVE METABOLIC PANEL
ALT: 22 U/L (ref 0–44)
AST: 32 U/L (ref 15–41)
Albumin: 2.8 g/dL — ABNORMAL LOW (ref 3.5–5.0)
Alkaline Phosphatase: 69 U/L (ref 38–126)
Anion gap: 9 (ref 5–15)
BUN: 8 mg/dL (ref 6–20)
CO2: 26 mmol/L (ref 22–32)
Calcium: 8.5 mg/dL — ABNORMAL LOW (ref 8.9–10.3)
Chloride: 100 mmol/L (ref 98–111)
Creatinine, Ser: 0.68 mg/dL (ref 0.44–1.00)
GFR, Estimated: >60 mL/min (ref >60)
Glucose, Bld: 150 mg/dL — ABNORMAL HIGH (ref 70–99)
Potassium: 3.4 mmol/L — ABNORMAL LOW (ref 3.5–5.1)
Sodium: 135 mmol/L (ref 135–145)
Total Bilirubin: 1 mg/dL (ref 0.3–1.2)
Total Protein: 5.5 g/dL — ABNORMAL LOW (ref 6.5–8.1)

## 2023-02-07 LAB — MAGNESIUM: Magnesium: 2 mg/dL (ref 1.7–2.4)

## 2023-02-07 MED ORDER — POTASSIUM CHLORIDE CRYS ER 20 MEQ PO TBCR
40.0000 meq | EXTENDED_RELEASE_TABLET | Freq: Once | ORAL | Status: AC
  Administered 2023-02-07: 40 meq via ORAL
  Filled 2023-02-07: qty 2

## 2023-02-07 MED ORDER — RIZATRIPTAN BENZOATE 10 MG PO TBDP
10.0000 mg | ORAL_TABLET | Freq: Every day | ORAL | Status: DC | PRN
  Filled 2023-02-07: qty 1

## 2023-02-07 MED ORDER — NON FORMULARY
10.0000 mg | Status: DC | PRN
  Administered 2023-02-07: 10 mg via ORAL

## 2023-02-07 MED FILL — Thrombin For Soln 5000 Unit: CUTANEOUS | Qty: 5000 | Status: AC

## 2023-02-07 NOTE — Consult Note (Signed)
NAME:  Rebecca Vargas, MRN:  [id number], DOB:  01/27/1966, LOS: 2 ADMISSION DATE:  02/05/2023, CONSULTATION DATE:  02/05/23 REFERRING MD:  Dawley CHIEF COMPLAINT:  Vent Managent   History of Present Illness:  Rebecca Vargas is a 57 y.o. female who has a PMH as below including chronic low back pain, radiculopathy, degenerative scoliosis. She failed conservative measures and ultimately presented to [facility] 2/29 for elective surgical intervention. She underwent T10-pelvis instrumentation and fusion (op note pending). Per Dr Reatha Armour, surgery was around 9 hours in total and pt was prone the duration; hence, once completed, anesthesia felt she would do best if remained intubated overnight at least.  She was transferred to the neuro ICU and PCCM asked to see in consultation for vent management.  Pertinent  Medical History:  has Cervicalgia; Primary osteoarthritis of right hip; Primary localized osteoarthritis of left hip; Scoliosis; Degenerative scoliosis in adult patient; and Acute respiratory failure with hypoxemia ([place]) on their problem list.  Significant Hospital Events: Including procedures, antibiotic start and stop dates in addition to other pertinent events   2/29 elective surgery T10 - pelvis instrumentation and fusion  Interim History / Subjective:   No acute events overnight No issues after extubation yesterday morning  Has some pain at surgical site.  Objective:  Blood pressure 112/72, pulse 87, temperature 99.8 F (37.7 C), temperature source Oral, resp. rate 19, height '5\' 7"'$  (1.702 m), weight 113.4 kg, SpO2 98 %.    FiO2 (%):  [36 %] 36 %   Intake/Output Summary (Last 24 hours) at 02/07/2023 [id number] Last data filed at 02/07/2023 0800 Gross per 24 hour  Intake 1200.4 ml  Output 1440 ml  Net -239.6 ml   Filed Weights   02/05/23 0607  Weight: 113.4 kg    Examination: General: Adult female, resting in bed, no acute distress Neuro: following commands, moving all extremities HEENT:  [place]/AT. Sclerae anicteric.  Cardiovascular: RRR, no M/R/G.  Lungs: Respirations even and unlabored.  CTA bilaterally, No W/R/R. Abdomen: BS x 4, soft, NT/ND.  Musculoskeletal: No gross deformities, no edema.  Skin: Intact, warm, no rashes.   Assessment & Plan:   Degenerative Scoliosis - failed conservative measures and ultimately underwent T10 - pelvis instrumentation and fusion 2/29 (Dr. Reatha Armour). 9 hours total surgery time with roughly 1L EBL (400 returned via cell saver) and pt received 2u PRBC and 1u FFP intra-op. - Post op care per neurosurgery.  Post operative ventilator dependence  OSA Hx OSA on CPAP. - Extubated 3/1, no issues post extubation - Bronchial hygiene. - Continue nocturnal CPAP   Hx Anxiety, Fibromyalgia, Chronic pain, Migraines. - Supportive care.  Hypokalemia - replete  PCCM will sign off, please call with any questions  Best practice (evaluated daily):  Diet/type: Regular consistency (see orders) DVT prophylaxis: SCD GI prophylaxis: PPI Lines: N/A Foley:  N/A Code Status:  full code Last date of multidisciplinary goals of care discussion: None yet.  Labs   CBC: Recent Labs  Lab 02/02/23 1543 02/05/23 [id number] 02/05/23 1633 02/05/23 1810 02/05/23 2022 02/06/23 0607 02/07/23 0603  WBC 6.7  --   --   --   --  8.9 9.1  HGB 13.1   < > 11.6* 11.2* 11.9* 12.4 11.1*  HCT 38.6   < > 34.0* 33.0* 35.0* 35.7* 32.0*  MCV 93.5  --   --   --   --  94.4 94.7  PLT 271  --   --   --   --  154 120*   < > = values in this interval not displayed.    Basic Metabolic Panel: Recent Labs  Lab 02/05/23 1633 02/05/23 1810 02/05/23 2022 02/06/23 0607 02/07/23 0603  NA 139 140 139 139 135  K 4.2 4.0 3.9 3.4* 3.4*  CL  --  107  --  109 100  CO2  --   --   --  20* 26  GLUCOSE  --  120*  --  130* 150*  BUN  --  11  --  11 8  CREATININE  --  0.50  --  0.49 0.68  CALCIUM  --   --   --  8.2* 8.5*  MG  --   --   --  1.8 2.0  PHOS  --   --   --  4.4  --     GFR: Estimated Creatinine Clearance: 102 mL/min (by C-G formula based on SCr of 0.68 mg/dL). Recent Labs  Lab 02/02/23 1543 02/06/23 0607 02/07/23 0603  WBC 6.7 8.9 9.1    Liver Function Tests: Recent Labs  Lab 02/06/23 0607 02/07/23 0603  AST 35 32  ALT 18 22  ALKPHOS 79 69  BILITOT 0.8 1.0  PROT 5.5* 5.5*  ALBUMIN 3.2* 2.8*   No results for input(s): "LIPASE", "AMYLASE" in the last 168 hours. No results for input(s): "AMMONIA" in the last 168 hours.  ABG    Component Value Date/Time   PHART 7.212 (L) 02/05/2023 2022   PCO2ART 57.5 (H) 02/05/2023 2022   PO2ART 174 (H) 02/05/2023 2022   HCO3 23.2 02/05/2023 2022   TCO2 25 02/05/2023 2022   ACIDBASEDEF 5.0 (H) 02/05/2023 2022   O2SAT 99 02/05/2023 2022     Coagulation Profile: No results for input(s): "INR", "PROTIME" in the last 168 hours.  Cardiac Enzymes: No results for input(s): "CKTOTAL", "CKMB", "CKMBINDEX", "TROPONINI" in the last 168 hours.  HbA1C: No results found for: "HGBA1C"  CBG: No results for input(s): "GLUCAP" in the last 168 hours.   Critical care time:    Freda Jackson, MD [facility] Office: [phone]   See Amion for personal pager PCCM on call pager [phone] until 7pm. Please call Elink 7p-7a. [phone]

## 2023-02-07 NOTE — Progress Notes (Signed)
  NEUROSURGERY PROGRESS NOTE   No issues overnight. Pt with appropriate back soreness, some pelvic pain but no leg pain.  EXAM:  BP 112/72   Pulse 87   Temp 99.8 F (37.7 C) (Oral)   Resp 19   Ht '5\' 7"'$  (1.702 m)   Wt 113.4 kg   SpO2 98%   BMI 39.16 kg/m   Awake, alert, oriented  Speech fluent, appropriate  CN grossly intact  5/5 BUE/BLE  Wound c/d/I  IMPRESSION:  57 y.o. female POD#1 thoracolumbar deformity correction, recovering as expected  PLAN: - Can transfer to stepdown today - Cont PT/OT - Can start SubQ heparin today   Consuella Lose, MD [facility]

## 2023-02-07 NOTE — Progress Notes (Signed)
Physical Therapy Treatment Patient Details Name: Rebecca Vargas MRN: [id number] DOB: Dec 12, 1965 Today's Date: 02/07/2023   History of Present Illness Pt is a 57 yo female presenting 2/29 for T10-pelvis instrumentation and fusion, L2-S1 TLIF, L1-S1 posterior column osteotomies for scoliosis correction and decompression. PMH includes: anxiety, arthritis, GERD, migraine, and bilateral THA.    PT Comments    The pt was agreeable to session, reports generally decreased pain today compared to yesterday. The pt was able to demo great improvement in ambulation distance and stability, but remains dependent on minA and BUE support on RW to maintain balance at this time. Pt continues to deny sx or pain to BLE, pain localized to incision. Continue to recommend skilled PT acutely and HHPT after d/c to maximize endurance and independence with transfers, but pt is making good progress at this time.     Recommendations for follow up therapy are one component of a multi-disciplinary discharge planning process, led by the attending physician.  Recommendations may be updated based on patient status, additional functional criteria and insurance authorization.  Follow Up Recommendations  Home health PT     Assistance Recommended at Discharge Frequent or constant Supervision/Assistance  Patient can return home with the following A little help with walking and/or transfers;A little help with bathing/dressing/bathroom;Assistance with cooking/housework;Assist for transportation   Equipment Recommendations  None recommended by PT    Recommendations for Other Services       Precautions / Restrictions Precautions Precautions: Fall;Back Precaution Booklet Issued: No Precaution Comments: verbally educated, will provide handout next visit Restrictions Weight Bearing Restrictions: No     Mobility  Bed Mobility Overal bed mobility: Needs Assistance Bed Mobility: Sit to Sidelying, Rolling Rolling: Min assist        Sit to sidelying: Min assist General bed mobility comments: continued education on log roll, minA to manage LE on return to bed and minA to control roll    Transfers Overall transfer level: Needs assistance Equipment used: Rolling walker (2 wheels) Transfers: Sit to/from Stand Sit to Stand: Min assist           General transfer comment: minA to rise, cues for hands on support (armrest or grab bar in bathroom)    Ambulation/Gait Ambulation/Gait assistance: Min assist Gait Distance (Feet): 60 Feet Assistive device: Rolling walker (2 wheels) Gait Pattern/deviations: Step-through pattern, Decreased stride length, Shuffle Gait velocity: decreased Gait velocity interpretation: <1.31 ft/sec, indicative of household ambulator   General Gait Details: small steps with minimal clearance but no obvious unilateral weakness or buckling. improved endurance and stability from prior session. minA to steady at times, especially if pt removes hand from RW and only has unilateral support     Balance Overall balance assessment: Needs assistance Sitting-balance support: Feet supported Sitting balance-Leahy Scale: Fair Sitting balance - Comments: stable statically   Standing balance support: Bilateral upper extremity supported, During functional activity Standing balance-Leahy Scale: Poor Standing balance comment: Pt needs BUE support for mobility                            Cognition Arousal/Alertness: Awake/alert Behavior During Therapy: WFL for tasks assessed/performed Overall Cognitive Status: Within Functional Limits for tasks assessed                                 General Comments: pt less groggy today        Exercises  General Comments General comments (skin integrity, edema, etc.): VSS on RA      Pertinent Vitals/Pain Pain Assessment Pain Assessment: 0-10 Pain Score: 6  Pain Location: back Pain Descriptors / Indicators:  Discomfort Pain Intervention(s): Limited activity within patient's tolerance, Monitored during session, Premedicated before session     PT Goals (current goals can now be found in the care plan section) Acute Rehab PT Goals Patient Stated Goal: be able to walk her dogs 1 mile PT Goal Formulation: With patient Time For Goal Achievement: 02/20/23 Potential to Achieve Goals: Good Progress towards PT goals: Progressing toward goals    Frequency    Min 3X/week      PT Plan Current plan remains appropriate       AM-PAC PT "6 Clicks" Mobility   Outcome Measure  Help needed turning from your back to your side while in a flat bed without using bedrails?: A Little Help needed moving from lying on your back to sitting on the side of a flat bed without using bedrails?: A Little Help needed moving to and from a bed to a chair (including a wheelchair)?: A Little Help needed standing up from a chair using your arms (e.g., wheelchair or bedside chair)?: A Little Help needed to walk in hospital room?: A Little Help needed climbing 3-5 steps with a railing? : A Lot 6 Click Score: 17    End of Session Equipment Utilized During Treatment: Gait belt Activity Tolerance: Patient tolerated treatment well;Patient limited by fatigue Patient left: with call bell/phone within reach;in bed;with bed alarm set;with family/visitor present Nurse Communication: Mobility status PT Visit Diagnosis: Other abnormalities of gait and mobility (R26.89);Muscle weakness (generalized) (M62.81)     Time: [id number] PT Time Calculation (min) (ACUTE ONLY): 19 min  Charges:  $Gait Training: 8-22 mins                     West Carbo, PT, DPT   Acute Rehabilitation Department Office [phone] Secure Chat Communication Preferred   Sandra Cockayne 02/07/2023, 2:35 PM

## 2023-02-08 LAB — COMPREHENSIVE METABOLIC PANEL
ALT: 22 U/L (ref 0–44)
AST: 31 U/L (ref 15–41)
Albumin: 2.7 g/dL — ABNORMAL LOW (ref 3.5–5.0)
Alkaline Phosphatase: 70 U/L (ref 38–126)
Anion gap: 7 (ref 5–15)
BUN: 7 mg/dL (ref 6–20)
CO2: 24 mmol/L (ref 22–32)
Calcium: 8.1 mg/dL — ABNORMAL LOW (ref 8.9–10.3)
Chloride: 100 mmol/L (ref 98–111)
Creatinine, Ser: 0.61 mg/dL (ref 0.44–1.00)
GFR, Estimated: >60 mL/min (ref >60)
Glucose, Bld: 150 mg/dL — ABNORMAL HIGH (ref 70–99)
Potassium: 3.5 mmol/L (ref 3.5–5.1)
Sodium: 131 mmol/L — ABNORMAL LOW (ref 135–145)
Total Bilirubin: 1 mg/dL (ref 0.3–1.2)
Total Protein: 5.4 g/dL — ABNORMAL LOW (ref 6.5–8.1)

## 2023-02-08 LAB — CBC
HCT: 31 % — ABNORMAL LOW (ref 36.0–46.0)
Hemoglobin: 10.3 g/dL — ABNORMAL LOW (ref 12.0–15.0)
MCH: 31.9 pg (ref 26.0–34.0)
MCHC: 33.2 g/dL (ref 30.0–36.0)
MCV: 96 fL (ref 80.0–100.0)
Platelets: 134 10*3/uL — ABNORMAL LOW (ref 150–400)
RBC: 3.23 MIL/uL — ABNORMAL LOW (ref 3.87–5.11)
RDW: 12.3 % (ref 11.5–15.5)
WBC: 9.4 10*3/uL (ref 4.0–10.5)
nRBC: 0 % (ref 0.0–0.2)

## 2023-02-08 MED ORDER — METHOCARBAMOL 500 MG PO TABS: 500.0000 mg | ORAL_TABLET | Freq: Three times a day (TID) | ORAL | 0 refills | Status: DC | PRN

## 2023-02-08 MED ORDER — OXYCODONE HCL 10 MG PO TABS: 10.0000 mg | ORAL_TABLET | ORAL | 0 refills | Status: AC | PRN

## 2023-02-08 NOTE — Plan of Care (Signed)
  Problem: Clinical Measurements: Goal: Will remain free from infection Outcome: Not Progressing   Problem: Activity: Goal: Risk for activity intolerance will decrease Outcome: Not Progressing   Problem: Elimination: Goal: Will not experience complications related to bowel motility Outcome: Not Progressing   Problem: Pain Managment: Goal: General experience of comfort will improve Outcome: Not Progressing

## 2023-02-08 NOTE — Progress Notes (Signed)
  NEUROSURGERY PROGRESS NOTE   No issues overnight. Pt reports controlled back pain. Ambulated well with PT yesterday. Requesting d/c home.  EXAM:  BP 119/74 (BP Location: Left Arm)   Pulse 90   Temp 97.8 F (36.6 C) (Oral)   Resp 18   Ht '5\' 7"'$  (1.702 m)   Wt 113.4 kg   SpO2 99%   BMI 39.16 kg/m   Awake, alert, oriented  Speech fluent, appropriate  CN grossly intact  5/5 BUE/BLE  Wound c/d/i Drain 170cc  IMPRESSION:  57 y.o. female POD# 3 s/p thoracolumbar deformity correction, recovering well. Neurologically and hemodynamically stable  PLAN: - d/c drain - can d/c home today with HHPT/OT   Consuella Lose, MD [facility]

## 2023-02-08 NOTE — Discharge Summary (Signed)
Physician Discharge Summary  Patient ID: AZIAH CEPERO MRN: [id number] DOB/AGE: 1966/10/25 57 y.o.  Admit date: 02/05/2023 Discharge date: 02/08/2023  Admission Diagnoses:  Scoliosis  Discharge Diagnoses:  Same Principal Problem:   Scoliosis Active Problems:   Degenerative scoliosis in adult patient   Acute respiratory failure with hypoxemia [facility])   Discharged Condition: Stable  Hospital Course:  Rebecca Vargas is a 57 y.o. female admitted for elective thoraclumbar scoliosis correction. She did well postoperatively, extubated on POD#1. She remained neurologically and hemodynamically stable. She was seen by PT /OT and made excellent improvement in her ambulation. She was felt to be safe for d/c home with home health therapy and requested d/c on POD# 3.  Treatments: Surgery - Thoracolumbar scoliosis correction  Discharge Exam: Blood pressure 119/74, pulse 90, temperature 97.8 F (36.6 C), temperature source Oral, resp. rate 18, height '5\' 7"'$  (1.702 m), weight 113.4 kg, SpO2 99 %. Awake, alert, oriented Speech fluent, appropriate CN grossly intact 5/5 BUE/BLE Wound c/d/i  Disposition: Discharge disposition: 01-Home or Self Care       Discharge Instructions     Call MD for:  redness, tenderness, or signs of infection (pain, swelling, redness, odor or green/yellow discharge around incision site)   Complete by: As directed    Call MD for:  temperature >100.4   Complete by: As directed    Diet - low sodium heart healthy   Complete by: As directed    Discharge instructions   Complete by: As directed    Walk at home as much as possible, at least 4 times / day   Incentive spirometry RT   Complete by: As directed    Increase activity slowly   Complete by: As directed    Lifting restrictions   Complete by: As directed    No lifting > 10 lbs   May shower / Bathe   Complete by: As directed    48 hours after surgery   May walk up steps   Complete by: As directed     Other Restrictions   Complete by: As directed    No bending/twisting at waist      Allergies as of 02/08/2023       Reactions   Amoxicillin Hives        Medication List     STOP taking these medications    aspirin EC 81 MG tablet   diclofenac 75 MG EC tablet Commonly known as: VOLTAREN   promethazine 12.5 MG tablet Commonly known as: PHENERGAN   tiZANidine 4 MG tablet Commonly known as: Zanaflex       TAKE these medications    acetaminophen 650 MG CR tablet Commonly known as: TYLENOL Take 1,300 mg by mouth every 8 (eight) hours as needed for pain.   Cholecalciferol 50 MCG (2000 UT) Tabs Take 2,000 Units by mouth at bedtime.   DULoxetine 60 MG capsule Commonly known as: CYMBALTA Take 60 mg by mouth daily.   estradiol 0.5 MG tablet Commonly known as: ESTRACE Take 0.5 mg by mouth at bedtime.   gabapentin 300 MG capsule Commonly known as: NEURONTIN Take 600-900 mg by mouth 3 (three) times daily as needed (pain).   Magnesium 500 MG Tabs Take 500 mg by mouth in the morning.   metFORMIN 500 MG 24 hr tablet Commonly known as: GLUCOPHAGE-XR Take 500 mg by mouth daily with supper.   methocarbamol 500 MG tablet Commonly known as: ROBAXIN Take 500 mg by mouth every 8 (eight) hours  as needed for muscle spasms. What changed: Another medication with the same name was added. Make sure you understand how and when to take each.   methocarbamol 500 MG tablet Commonly known as: ROBAXIN Take 1 tablet (500 mg total) by mouth every 8 (eight) hours as needed for muscle spasms. What changed: You were already taking a medication with the same name, and this prescription was added. Make sure you understand how and when to take each.   omeprazole 40 MG capsule Commonly known as: PRILOSEC Take 40 mg by mouth in the morning.   Oxycodone HCl 10 MG Tabs Take 1 tablet (10 mg total) by mouth every 4 (four) hours as needed for up to 7 days for severe pain ((score 7 to 10)).    progesterone 100 MG capsule Commonly known as: PROMETRIUM Take 100 mg by mouth at bedtime.   rizatriptan 5 MG tablet Commonly known as: MAXALT Take 5 mg by mouth as needed for migraine. May repeat in 2 hours if needed   senna 8.6 MG Tabs tablet Commonly known as: SENOKOT Take 2 tablets by mouth at bedtime.   spironolactone 100 MG tablet Commonly known as: ALDACTONE Take 100 mg by mouth in the morning.   topiramate 25 MG tablet Commonly known as: TOPAMAX Take 100 mg by mouth in the morning.        Follow-up Information     Dawley, Troy C, DO Follow up.   Contact information: [address] [place] [address] [phone]                 Signed: Jairo Ben 02/08/2023, 11:33 AM

## 2023-02-08 NOTE — TOC Transition Note (Addendum)
Transition of Care [facility]) - CM/SW Discharge Note   Patient Details  Name: Rebecca Vargas MRN: [id number] Date of Birth: Apr 11, 1966  [facility]) CM/SW Contact:  Zenon Mayo, RN Phone Number: 02/08/2023, 12:35 PM   Clinical Narrative:       Final next level of care: [place] [facility] Barriers to Discharge: No Barriers Identified   Patient Goals and CMS Choice CMS Medicare.gov Compare Post Acute Care list provided to:: Patient Choice offered to / list presented to : Patient  Discharge Placement    NCM spoke with patient, she is agreeable to [facility] services. Offered choice, she does not have a preference.  NCM made referral to Amy with [facility],  she said he will take the referral.  Soc will begin 24 to 48 hrs post dc.  She has transportation home today.                     Discharge Plan and Services Additional resources added to the After Visit Summary for                  DME Arranged: N/A DME Agency: NA       HH Arranged: PT, OT HH Agency [facility] Date HH Agency Contacted: 02/08/23 Time [place]: 1149 Representative spoke with at [place] ([place]) Interventions [place]: No Food Insecurity (02/07/2023)  Housing: Low Risk  (02/07/2023)  Transportation Needs: No Transportation Needs (02/07/2023)  Utilities: Not At Risk (02/07/2023)  Tobacco Use: Low Risk  (02/06/2023)     Readmission Risk Interventions     No data to display

## 2023-02-13 MED FILL — Sodium Chloride Irrigation Soln 0.9%: Qty: 3000 | Status: AC

## 2023-02-13 MED FILL — Heparin Sodium (Porcine) Inj 1000 Unit/ML: INTRAMUSCULAR | Qty: 30 | Status: AC

## 2023-02-13 MED FILL — Sodium Chloride IV Soln 0.9%: INTRAVENOUS | Qty: 1000 | Status: AC

## 2023-05-11 ENCOUNTER — Other Ambulatory Visit: Payer: Self-pay | Admitting: Nurse Practitioner

## 2023-05-11 DIAGNOSIS — E220 Acromegaly and pituitary gigantism: Secondary | ICD-10-CM

## 2023-05-15 ENCOUNTER — Encounter: Payer: Self-pay | Admitting: Endocrinology

## 2023-05-18 ENCOUNTER — Ambulatory Visit
Admission: RE | Admit: 2023-05-18 | Discharge: 2023-05-18 | Disposition: A | Discharge status: Home / Self Care | Payer: Commercial Managed Care - HMO | Source: Ambulatory Visit | Attending: Nurse Practitioner | Admitting: Nurse Practitioner

## 2023-05-18 DIAGNOSIS — E220 Acromegaly and pituitary gigantism: Secondary | ICD-10-CM

## 2023-05-18 MED ORDER — GADOPICLENOL 0.5 MMOL/ML IV SOLN
10.0000 mL | Freq: Once | INTRAVENOUS | Status: AC | PRN
  Administered 2023-05-18: 10 mL via INTRAVENOUS

## 2023-05-27 ENCOUNTER — Other Ambulatory Visit: Payer: Commercial Managed Care - HMO

## 2023-09-11 ENCOUNTER — Other Ambulatory Visit: Payer: Self-pay | Admitting: Neurological Surgery

## 2023-09-14 ENCOUNTER — Other Ambulatory Visit: Payer: Self-pay | Admitting: Otolaryngology

## 2023-09-17 NOTE — Pre-Procedure Instructions (Signed)
Surgical Instructions   Your procedure is scheduled on September 25, 2023. Report to [facility] Main Entrance "A" at 6:30 A.M., then check in with the Admitting office. Any questions or running late day of surgery: call [phone]  Questions prior to your surgery date: call [phone], Monday-Friday, 8am-4pm. If you experience any cold or flu symptoms such as cough, fever, chills, shortness of breath, etc. between now and your scheduled surgery, please notify us at the above number.     Remember:  Do not eat after midnight the night before your surgery   You may drink clear liquids until 5:30 AM the morning of your surgery.   Clear liquids allowed are: Water, Non-Citrus Juices (without pulp), Carbonated Beverages, Clear Tea, Black Coffee Only (NO MILK, CREAM OR POWDERED CREAMER of any kind), and Gatorade.    Take these medicines the morning of surgery with A SIP OF WATER: DULoxetine (CYMBALTA)  omeprazole (PRILOSEC)  topiramate (TOPAMAX)    May take these medicines IF NEEDED: acetaminophen (TYLENOL)  rizatriptan (MAXALT)    One week prior to surgery, STOP taking any Aspirin (unless otherwise instructed by your surgeon) Aleve, Naproxen, Ibuprofen, Motrin, Advil, Goody's, BC's, all herbal medications, fish oil, and non-prescription vitamins.   WHAT DO I DO ABOUT MY DIABETES MEDICATION?   Do not take metFORMIN (GLUCOPHAGE-XR) the morning of surgery.   HOW TO MANAGE YOUR DIABETES BEFORE AND AFTER SURGERY  Why is it important to control my blood sugar before and after surgery? Improving blood sugar levels before and after surgery helps healing and can limit problems. A way of improving blood sugar control is eating a healthy diet by:  Eating less sugar and carbohydrates  Increasing activity/exercise  Talking with your doctor about reaching your blood sugar goals High blood sugars (greater than 180 mg/dL) can raise your risk of infections and slow your recovery, so you  will need to focus on controlling your diabetes during the weeks before surgery. Make sure that the doctor who takes care of your diabetes knows about your planned surgery including the date and location.  How do I manage my blood sugar before surgery? Check your blood sugar at least 4 times a day, starting 2 days before surgery, to make sure that the level is not too high or low.  Check your blood sugar the morning of your surgery when you wake up and every 2 hours until you get to the Short Stay unit.  If your blood sugar is less than 70 mg/dL, you will need to treat for low blood sugar: Do not take insulin. Treat a low blood sugar (less than 70 mg/dL) with  cup of clear juice (cranberry or apple), 4 glucose tablets, OR glucose gel. Recheck blood sugar in 15 minutes after treatment (to make sure it is greater than 70 mg/dL). If your blood sugar is not greater than 70 mg/dL on recheck, call [phone] for further instructions. Report your blood sugar to the short stay nurse when you get to Short Stay.  If you are admitted to the hospital after surgery: Your blood sugar will be checked by the staff and you will probably be given insulin after surgery (instead of oral diabetes medicines) to make sure you have good blood sugar levels. The goal for blood sugar control after surgery is 80-180 mg/dL.                      Do NOT Smoke (Tobacco/Vaping) for 24 hours prior to your  procedure.  If you use a CPAP at night, you may bring your mask/headgear for your overnight stay.   You will be asked to remove any contacts, glasses, piercing's, hearing aid's, dentures/partials prior to surgery. Please bring cases for these items if needed.    Patients discharged the day of surgery will not be allowed to drive home, and someone needs to stay with them for 24 hours.  SURGICAL WAITING ROOM VISITATION Patients may have no more than 2 support people in the waiting area - these visitors may rotate.    Pre-op nurse will coordinate an appropriate time for 1 ADULT support person, who may not rotate, to accompany patient in pre-op.  Children under the age of 14 must have an adult with them who is not the patient and must remain in the main waiting area with an adult.  If the patient needs to stay at the hospital during part of their recovery, the visitor guidelines for inpatient rooms apply.  Please refer to the [facility] website for the visitor guidelines for any additional information.   If you received a COVID test during your pre-op visit  it is requested that you wear a mask when out in public, stay away from anyone that may not be feeling well and notify your surgeon if you develop symptoms. If you have been in contact with anyone that has tested positive in the last 10 days please notify you surgeon.      Pre-operative CHG Bathing Instructions   You can play a key role in reducing the risk of infection after surgery. Your skin needs to be as free of germs as possible. You can reduce the number of germs on your skin by washing with CHG (chlorhexidine gluconate) soap before surgery. CHG is an antiseptic soap that kills germs and continues to kill germs even after washing.   DO NOT use if you have an allergy to chlorhexidine/CHG or antibacterial soaps. If your skin becomes reddened or irritated, stop using the CHG and notify one of our RNs at [phone].              TAKE A SHOWER THE NIGHT BEFORE SURGERY AND THE DAY OF SURGERY    Please keep in mind the following:  DO NOT shave, including legs and underarms, 48 hours prior to surgery.   You may shave your face before/day of surgery.  Place clean sheets on your bed the night before surgery Use a clean washcloth (not used since being washed) for each shower. DO NOT sleep with pet's night before surgery.  CHG Shower Instructions:  Wash your face and private area with normal soap. If you choose to wash your hair, wash first with  your normal shampoo.  After you use shampoo/soap, rinse your hair and body thoroughly to remove shampoo/soap residue.  Turn the water OFF and apply half the bottle of CHG soap to a CLEAN washcloth.  Apply CHG soap ONLY FROM YOUR NECK DOWN TO YOUR TOES (washing for 3-5 minutes)  DO NOT use CHG soap on face, private areas, open wounds, or sores.  Pay special attention to the area where your surgery is being performed.  If you are having back surgery, having someone wash your back for you may be helpful. Wait 2 minutes after CHG soap is applied, then you may rinse off the CHG soap.  Pat dry with a clean towel  Put on clean pajamas    Additional instructions for the day of surgery: DO  NOT APPLY any lotions, deodorants, cologne, or perfumes.   Do not wear jewelry or makeup Do not wear nail polish, gel polish, artificial nails, or any other type of covering on natural nails (fingers and toes) Do not bring valuables to the hospital. [facility] is not responsible for valuables/personal belongings. Put on clean/comfortable clothes.  Please brush your teeth.  Ask your nurse before applying any prescription medications to the skin.

## 2023-09-18 ENCOUNTER — Other Ambulatory Visit: Payer: Self-pay

## 2023-09-18 ENCOUNTER — Encounter (HOSPITAL_COMMUNITY): Payer: Self-pay

## 2023-09-18 ENCOUNTER — Encounter (HOSPITAL_COMMUNITY)
Admission: RE | Admit: 2023-09-18 | Discharge: 2023-09-18 | Disposition: A | Discharge status: Home / Self Care | Payer: Managed Care, Other (non HMO) | Source: Ambulatory Visit | Attending: Neurological Surgery | Admitting: Neurological Surgery

## 2023-09-18 ENCOUNTER — Encounter (HOSPITAL_COMMUNITY): Payer: Self-pay | Admitting: Neurological Surgery

## 2023-09-18 DIAGNOSIS — Z01818 Encounter for other preprocedural examination: Secondary | ICD-10-CM | POA: Diagnosis not present

## 2023-09-18 DIAGNOSIS — Z01810 Encounter for preprocedural cardiovascular examination: Secondary | ICD-10-CM | POA: Diagnosis present

## 2023-09-18 DIAGNOSIS — Z01812 Encounter for preprocedural laboratory examination: Secondary | ICD-10-CM | POA: Diagnosis present

## 2023-09-18 HISTORY — DX: Acromegaly and pituitary gigantism: E22.0

## 2023-09-18 LAB — CBC
HCT: 37.3 % (ref 36.0–46.0)
Hemoglobin: 12.2 g/dL (ref 12.0–15.0)
MCH: 29.5 pg (ref 26.0–34.0)
MCHC: 32.7 g/dL (ref 30.0–36.0)
MCV: 90.3 fL (ref 80.0–100.0)
Platelets: 225 10*3/uL (ref 150–400)
RBC: 4.13 MIL/uL (ref 3.87–5.11)
RDW: 12.9 % (ref 11.5–15.5)
WBC: 4.5 10*3/uL (ref 4.0–10.5)
nRBC: 0 % (ref 0.0–0.2)

## 2023-09-18 LAB — BASIC METABOLIC PANEL
Anion gap: 10 (ref 5–15)
BUN: 7 mg/dL (ref 6–20)
CO2: 23 mmol/L (ref 22–32)
Calcium: 9.3 mg/dL (ref 8.9–10.3)
Chloride: 105 mmol/L (ref 98–111)
Creatinine, Ser: 0.64 mg/dL (ref 0.44–1.00)
GFR, Estimated: >60 mL/min (ref >60)
Glucose, Bld: 103 mg/dL — ABNORMAL HIGH (ref 70–99)
Potassium: 3.7 mmol/L (ref 3.5–5.1)
Sodium: 138 mmol/L (ref 135–145)

## 2023-09-18 LAB — HEMOGLOBIN A1C
Hgb A1c MFr Bld: 6.2 % — ABNORMAL HIGH (ref 4.8–5.6)
Mean Plasma Glucose: 131.24 mg/dL

## 2023-09-18 NOTE — Progress Notes (Signed)
Patient states recently diagnosed with Acromegaly and that she is not a diabetic.  States acromegaly can lead to diabetes but as of right now she gets her A1C checked regularly and is currently taking metformin daily.

## 2023-09-18 NOTE — Progress Notes (Signed)
PCP - Dr. Porfirio Oar Cardiologist - n/a  PPM/ICD - DENIES Device Orders - N/A Rep Notified - N/A  Chest x-ray - 02-06-23 EKG - 09-18-23 Stress Test - DENIES ECHO - DENIES Cardiac Cath - DENIES  Sleep Study - Y CPAP - Y - does not know her settings  Fasting Blood Sugar - DENIES Checks Blood Sugar _____ times a day N/A  Last dose of GLP1 agonist-  N/A GLP1 instructions: N/A  Blood Thinner Instructions: Y Aspirin Instructions: Y  ERAS Protcol - Y PRE-SURGERY Ensure or G2- NONE  COVID TEST- N   Anesthesia review: Y, spine sx on 02/05/23 and was left on ventilator overnight. Extubated on 02/06/23. Pt states that was decided prior to surgery d/t the length of the surgery and that the surgeon wanted the patient to wake up slower to the extent of the surgery. Has OSA w/CPAP.  Patient denies shortness of breath, fever, cough and chest pain at PAT appointment. Patient denies any respiratory illness or issues at this time.   All instructions explained to the patient, with a verbal understanding of the material. Patient agrees to go over the instructions while at home for a better understanding. Patient also instructed to self quarantine after being tested for COVID-19. The opportunity to ask questions was provided.

## 2023-09-24 NOTE — Anesthesia Preprocedure Evaluation (Addendum)
Anesthesia Evaluation  Patient identified by MRN, date of birth, ID band Patient awake    Reviewed: Allergy & Precautions, H&P , NPO status , Patient's Chart, lab work & pertinent test results  History of Anesthesia Complications (+) PONV and history of anesthetic complications  Airway Mallampati: II  TM Distance: >3 FB Neck ROM: Full    Dental no notable dental hx.    Pulmonary sleep apnea    Pulmonary exam normal breath sounds clear to auscultation       Cardiovascular negative cardio ROS Normal cardiovascular exam Rhythm:Regular Rate:Normal     Neuro/Psych  Headaches PSYCHIATRIC DISORDERS Anxiety Depression    Pituitary mass    GI/Hepatic Neg liver ROS,GERD  ,,  Endo/Other  negative endocrine ROS    Renal/GU negative Renal ROS  negative genitourinary   Musculoskeletal  (+) Arthritis ,  Fibromyalgia -  Abdominal   Peds negative pediatric ROS (+)  Hematology  (+) Blood dyscrasia, anemia   Anesthesia Other Findings acromegaly  Reproductive/Obstetrics negative OB ROS                             Anesthesia Physical Anesthesia Plan  ASA: 3  Anesthesia Plan: General   Post-op Pain Management:    Induction: Intravenous  PONV Risk Score and Plan: Ondansetron, Dexamethasone and Scopolamine patch - Pre-op  Airway Management Planned: Oral ETT  Additional Equipment:   Intra-op Plan:   Post-operative Plan: Extubation in OR  Informed Consent: I have reviewed the patients History and Physical, chart, labs and discussed the procedure including the risks, benefits and alternatives for the proposed anesthesia with the patient or authorized representative who has indicated his/her understanding and acceptance.     Dental advisory given  Plan Discussed with: CRNA  Anesthesia Plan Comments:        Anesthesia Quick Evaluation

## 2023-09-25 ENCOUNTER — Encounter (HOSPITAL_COMMUNITY): Payer: Self-pay | Admitting: Neurological Surgery

## 2023-09-25 ENCOUNTER — Encounter (HOSPITAL_COMMUNITY)
Admission: RE | Disposition: A | Discharge status: Home / Self Care | Payer: Self-pay | Source: Home / Self Care | Attending: Neurological Surgery

## 2023-09-25 ENCOUNTER — Inpatient Hospital Stay (HOSPITAL_COMMUNITY): Payer: Commercial Managed Care - HMO | Admitting: Physician Assistant

## 2023-09-25 ENCOUNTER — Inpatient Hospital Stay (HOSPITAL_COMMUNITY)
Admission: RE | Admit: 2023-09-25 | Discharge: 2023-09-29 | DRG: 614 | Disposition: A | Discharge status: Home / Self Care | Payer: Commercial Managed Care - HMO | Attending: Neurological Surgery | Admitting: Neurological Surgery

## 2023-09-25 ENCOUNTER — Other Ambulatory Visit: Payer: Self-pay

## 2023-09-25 DIAGNOSIS — M2619 Other specified anomalies of jaw-cranial base relationship: Secondary | ICD-10-CM | POA: Diagnosis present

## 2023-09-25 DIAGNOSIS — E232 Diabetes insipidus: Secondary | ICD-10-CM | POA: Diagnosis not present

## 2023-09-25 DIAGNOSIS — D497 Neoplasm of unspecified behavior of endocrine glands and other parts of nervous system: Secondary | ICD-10-CM

## 2023-09-25 DIAGNOSIS — R3589 Other polyuria: Secondary | ICD-10-CM | POA: Diagnosis present

## 2023-09-25 DIAGNOSIS — G4733 Obstructive sleep apnea (adult) (pediatric): Secondary | ICD-10-CM | POA: Diagnosis present

## 2023-09-25 DIAGNOSIS — K219 Gastro-esophageal reflux disease without esophagitis: Secondary | ICD-10-CM | POA: Diagnosis present

## 2023-09-25 DIAGNOSIS — D352 Benign neoplasm of pituitary gland: Secondary | ICD-10-CM | POA: Diagnosis present

## 2023-09-25 DIAGNOSIS — E220 Acromegaly and pituitary gigantism: Secondary | ICD-10-CM | POA: Diagnosis present

## 2023-09-25 DIAGNOSIS — E119 Type 2 diabetes mellitus without complications: Secondary | ICD-10-CM | POA: Diagnosis present

## 2023-09-25 DIAGNOSIS — Z981 Arthrodesis status: Secondary | ICD-10-CM

## 2023-09-25 DIAGNOSIS — M797 Fibromyalgia: Secondary | ICD-10-CM | POA: Diagnosis present

## 2023-09-25 DIAGNOSIS — G43909 Migraine, unspecified, not intractable, without status migrainosus: Secondary | ICD-10-CM | POA: Diagnosis present

## 2023-09-25 DIAGNOSIS — E669 Obesity, unspecified: Secondary | ICD-10-CM | POA: Diagnosis present

## 2023-09-25 DIAGNOSIS — T502X5A Adverse effect of carbonic-anhydrase inhibitors, benzothiadiazides and other diuretics, initial encounter: Secondary | ICD-10-CM | POA: Diagnosis present

## 2023-09-25 DIAGNOSIS — E785 Hyperlipidemia, unspecified: Secondary | ICD-10-CM | POA: Diagnosis present

## 2023-09-25 DIAGNOSIS — Z96643 Presence of artificial hip joint, bilateral: Secondary | ICD-10-CM | POA: Diagnosis present

## 2023-09-25 DIAGNOSIS — Z6839 Body mass index (BMI) 39.0-39.9, adult: Secondary | ICD-10-CM | POA: Diagnosis not present

## 2023-09-25 DIAGNOSIS — I10 Essential (primary) hypertension: Secondary | ICD-10-CM | POA: Diagnosis present

## 2023-09-25 DIAGNOSIS — J342 Deviated nasal septum: Secondary | ICD-10-CM

## 2023-09-25 DIAGNOSIS — Z01818 Encounter for other preprocedural examination: Principal | ICD-10-CM

## 2023-09-25 HISTORY — PX: SEPTOPLASTY: SHX2393

## 2023-09-25 HISTORY — PX: CRANIOTOMY: SHX93

## 2023-09-25 HISTORY — PX: TRANSPHENOIDAL APPROACH EXPOSURE: SHX6311

## 2023-09-25 LAB — CBC
HCT: 36.8 % (ref 36.0–46.0)
Hemoglobin: 12.2 g/dL (ref 12.0–15.0)
MCH: 28.9 pg (ref 26.0–34.0)
MCHC: 33.2 g/dL (ref 30.0–36.0)
MCV: 87.2 fL (ref 80.0–100.0)
Platelets: 233 10*3/uL (ref 150–400)
RBC: 4.22 MIL/uL (ref 3.87–5.11)
RDW: 12.9 % (ref 11.5–15.5)
WBC: 9.2 10*3/uL (ref 4.0–10.5)
nRBC: 0 % (ref 0.0–0.2)

## 2023-09-25 LAB — GLUCOSE, CAPILLARY: Glucose-Capillary: 123 mg/dL — ABNORMAL HIGH (ref 70–99)

## 2023-09-25 LAB — CREATININE, SERUM
Creatinine, Ser: 0.52 mg/dL (ref 0.44–1.00)
GFR, Estimated: >60 mL/min (ref >60)

## 2023-09-25 SURGERY — CRANIOTOMY HYPOPHYSECTOMY TRANSNASAL APPROACH
Anesthesia: General | Site: Nose

## 2023-09-25 MED ORDER — ACETAMINOPHEN 10 MG/ML IV SOLN
INTRAVENOUS | Status: AC
  Filled 2023-09-25: qty 100

## 2023-09-25 MED ORDER — DROPERIDOL 2.5 MG/ML IJ SOLN
0.6250 mg | Freq: Once | INTRAMUSCULAR | Status: AC | PRN
  Administered 2023-09-25: 0.625 mg via INTRAVENOUS

## 2023-09-25 MED ORDER — FENTANYL CITRATE (PF) 100 MCG/2ML IJ SOLN
INTRAMUSCULAR | Status: AC
  Filled 2023-09-25: qty 2

## 2023-09-25 MED ORDER — ACETAMINOPHEN 10 MG/ML IV SOLN: 1000.0000 mg | Freq: Once | INTRAVENOUS | Status: DC | PRN

## 2023-09-25 MED ORDER — PROGESTERONE MICRONIZED 100 MG PO CAPS
100.0000 mg | ORAL_CAPSULE | Freq: Every day | ORAL | Status: DC
  Administered 2023-09-25 – 2023-09-28 (×4): 100 mg via ORAL
  Filled 2023-09-25 (×5): qty 1

## 2023-09-25 MED ORDER — OXYCODONE HCL 5 MG PO TABS: 5.0000 mg | ORAL_TABLET | Freq: Once | ORAL | Status: DC | PRN

## 2023-09-25 MED ORDER — DROPERIDOL 2.5 MG/ML IJ SOLN
INTRAMUSCULAR | Status: AC
  Filled 2023-09-25: qty 2

## 2023-09-25 MED ORDER — LACTATED RINGERS IV SOLN: INTRAVENOUS | Status: DC

## 2023-09-25 MED ORDER — DEXAMETHASONE SODIUM PHOSPHATE 10 MG/ML IJ SOLN
INTRAMUSCULAR | Status: DC | PRN
  Administered 2023-09-25: 10 mg via INTRAVENOUS

## 2023-09-25 MED ORDER — THROMBIN 5000 UNITS EX SOLR
CUTANEOUS | Status: AC
  Filled 2023-09-25: qty 5000

## 2023-09-25 MED ORDER — SODIUM CHLORIDE 0.9 % IV SOLN
0.1500 ug/kg/min | Freq: Once | INTRAVENOUS | Status: AC
  Administered 2023-09-25: .15 ug/kg/min via INTRAVENOUS
  Filled 2023-09-25: qty 2000

## 2023-09-25 MED ORDER — ONDANSETRON HCL 4 MG/2ML IJ SOLN
4.0000 mg | INTRAMUSCULAR | Status: DC | PRN
  Administered 2023-09-25: 4 mg via INTRAVENOUS
  Filled 2023-09-25 (×2): qty 2

## 2023-09-25 MED ORDER — DULOXETINE HCL 60 MG PO CPEP
60.0000 mg | ORAL_CAPSULE | Freq: Every day | ORAL | Status: DC
  Administered 2023-09-26 – 2023-09-29 (×4): 60 mg via ORAL
  Filled 2023-09-25 (×4): qty 1

## 2023-09-25 MED ORDER — CHLORHEXIDINE GLUCONATE CLOTH 2 % EX PADS: 6.0000 | MEDICATED_PAD | Freq: Once | CUTANEOUS | Status: DC

## 2023-09-25 MED ORDER — BACITRACIN ZINC 500 UNIT/GM EX OINT
TOPICAL_OINTMENT | CUTANEOUS | Status: DC | PRN
Start: 2023-09-25 — End: 2023-09-25
  Administered 2023-09-25: 1 via TOPICAL

## 2023-09-25 MED ORDER — DIPHENHYDRAMINE HCL 50 MG/ML IJ SOLN
INTRAMUSCULAR | Status: AC
  Filled 2023-09-25: qty 1

## 2023-09-25 MED ORDER — VANCOMYCIN HCL 1500 MG/300ML IV SOLN
1500.0000 mg | INTRAVENOUS | Status: AC
  Administered 2023-09-25: 1500 mg via INTRAVENOUS
  Filled 2023-09-25: qty 300

## 2023-09-25 MED ORDER — ONDANSETRON HCL 4 MG/2ML IJ SOLN
INTRAMUSCULAR | Status: DC | PRN
  Administered 2023-09-25: 4 mg via INTRAVENOUS

## 2023-09-25 MED ORDER — LIDOCAINE 2% (20 MG/ML) 5 ML SYRINGE
INTRAMUSCULAR | Status: AC
  Filled 2023-09-25: qty 5

## 2023-09-25 MED ORDER — SCOPOLAMINE 1 MG/3DAYS TD PT72
MEDICATED_PATCH | TRANSDERMAL | Status: AC
  Administered 2023-09-25: 1.5 mg via TRANSDERMAL
  Filled 2023-09-25: qty 1

## 2023-09-25 MED ORDER — HYDROMORPHONE HCL 1 MG/ML IJ SOLN
0.5000 mg | INTRAMUSCULAR | Status: DC | PRN
  Administered 2023-09-25 (×2): 0.5 mg via INTRAVENOUS
  Filled 2023-09-25 (×2): qty 1

## 2023-09-25 MED ORDER — SUMATRIPTAN SUCCINATE 100 MG PO TABS
100.0000 mg | ORAL_TABLET | ORAL | Status: DC | PRN
  Administered 2023-09-27 (×2): 100 mg via ORAL
  Filled 2023-09-25 (×5): qty 1

## 2023-09-25 MED ORDER — LABETALOL HCL 5 MG/ML IV SOLN
10.0000 mg | INTRAVENOUS | Status: DC | PRN
  Administered 2023-09-25: 20 mg via INTRAVENOUS
  Filled 2023-09-25: qty 4

## 2023-09-25 MED ORDER — ADHERUS DURAL SEALANT
PACK | TOPICAL | Status: DC | PRN
Start: 2023-09-25 — End: 2023-09-25
  Administered 2023-09-25: 1 via TOPICAL

## 2023-09-25 MED ORDER — SODIUM CHLORIDE 0.9 % IR SOLN
Status: DC | PRN
  Administered 2023-09-25: 1000 mL

## 2023-09-25 MED ORDER — OXYMETAZOLINE HCL 0.05 % NA SOLN: 2.0000 | Freq: Two times a day (BID) | NASAL | Status: AC | PRN

## 2023-09-25 MED ORDER — EPINEPHRINE HCL (NASAL) 0.1 % NA SOLN
NASAL | Status: DC | PRN
  Administered 2023-09-25: 1 [drp] via NASAL

## 2023-09-25 MED ORDER — PROPOFOL 10 MG/ML IV BOLUS
INTRAVENOUS | Status: DC | PRN
  Administered 2023-09-25: 5 mg via INTRAVENOUS
  Administered 2023-09-25: 150 mg via INTRAVENOUS

## 2023-09-25 MED ORDER — METFORMIN HCL ER 500 MG PO TB24
500.0000 mg | ORAL_TABLET | Freq: Every day | ORAL | Status: DC
  Administered 2023-09-26 – 2023-09-28 (×3): 500 mg via ORAL
  Filled 2023-09-25 (×5): qty 1

## 2023-09-25 MED ORDER — CHLORHEXIDINE GLUCONATE CLOTH 2 % EX PADS
6.0000 | MEDICATED_PAD | Freq: Every day | CUTANEOUS | Status: DC
  Administered 2023-09-26 – 2023-09-29 (×4): 6 via TOPICAL

## 2023-09-25 MED ORDER — SUGAMMADEX SODIUM 200 MG/2ML IV SOLN
INTRAVENOUS | Status: DC | PRN
  Administered 2023-09-25: 200 mg via INTRAVENOUS

## 2023-09-25 MED ORDER — EPINEPHRINE HCL (NASAL) 0.1 % NA SOLN
NASAL | Status: AC
  Filled 2023-09-25: qty 60

## 2023-09-25 MED ORDER — 0.9 % SODIUM CHLORIDE (POUR BTL) OPTIME
TOPICAL | Status: DC | PRN
  Administered 2023-09-25: 1000 mL

## 2023-09-25 MED ORDER — LIDOCAINE-EPINEPHRINE 1 %-1:100000 IJ SOLN
INTRAMUSCULAR | Status: AC
  Filled 2023-09-25: qty 1

## 2023-09-25 MED ORDER — ROCURONIUM BROMIDE 10 MG/ML (PF) SYRINGE
PREFILLED_SYRINGE | INTRAVENOUS | Status: DC | PRN
  Administered 2023-09-25: 70 mg via INTRAVENOUS
  Administered 2023-09-25: 30 mg via INTRAVENOUS
  Administered 2023-09-25: 20 mg via INTRAVENOUS

## 2023-09-25 MED ORDER — ONDANSETRON HCL 4 MG/2ML IJ SOLN
INTRAMUSCULAR | Status: AC
  Filled 2023-09-25: qty 2

## 2023-09-25 MED ORDER — PHENYLEPHRINE HCL-NACL 20-0.9 MG/250ML-% IV SOLN
INTRAVENOUS | Status: DC | PRN
  Administered 2023-09-25: 25 ug/min via INTRAVENOUS

## 2023-09-25 MED ORDER — DEXAMETHASONE SODIUM PHOSPHATE 10 MG/ML IJ SOLN
INTRAMUSCULAR | Status: AC
  Filled 2023-09-25: qty 1

## 2023-09-25 MED ORDER — ACETAMINOPHEN 650 MG RE SUPP: 650.0000 mg | RECTAL | Status: DC | PRN

## 2023-09-25 MED ORDER — HEPARIN SODIUM (PORCINE) 5000 UNIT/ML IJ SOLN
5000.0000 [IU] | Freq: Three times a day (TID) | INTRAMUSCULAR | Status: DC
  Administered 2023-09-27 – 2023-09-29 (×7): 5000 [IU] via SUBCUTANEOUS
  Filled 2023-09-25 (×6): qty 1

## 2023-09-25 MED ORDER — PROMETHAZINE HCL 25 MG PO TABS
12.5000 mg | ORAL_TABLET | ORAL | Status: DC | PRN
  Administered 2023-09-27 – 2023-09-28 (×4): 25 mg via ORAL
  Filled 2023-09-25 (×4): qty 1

## 2023-09-25 MED ORDER — SCOPOLAMINE 1 MG/3DAYS TD PT72: 1.0000 | MEDICATED_PATCH | TRANSDERMAL | Status: DC

## 2023-09-25 MED ORDER — SALINE SPRAY 0.65 % NA SOLN
3.0000 | NASAL | Status: DC
  Administered 2023-09-25 – 2023-09-29 (×18): 3 via NASAL
  Filled 2023-09-25: qty 44

## 2023-09-25 MED ORDER — HYDROCODONE-ACETAMINOPHEN 5-325 MG PO TABS
1.0000 | ORAL_TABLET | ORAL | Status: DC | PRN
  Administered 2023-09-25 – 2023-09-27 (×4): 1 via ORAL
  Filled 2023-09-25 (×4): qty 1

## 2023-09-25 MED ORDER — ORAL CARE MOUTH RINSE: 15.0000 mL | Freq: Once | OROMUCOSAL | Status: AC

## 2023-09-25 MED ORDER — TOPIRAMATE 25 MG PO TABS
50.0000 mg | ORAL_TABLET | Freq: Every morning | ORAL | Status: DC
  Administered 2023-09-26 – 2023-09-29 (×4): 50 mg via ORAL
  Filled 2023-09-25 (×4): qty 2

## 2023-09-25 MED ORDER — THROMBIN 5000 UNITS EX SOLR
OROMUCOSAL | Status: DC | PRN
  Administered 2023-09-25: 5 mL via TOPICAL

## 2023-09-25 MED ORDER — OLOPATADINE HCL 0.1 % OP SOLN: 1.0000 [drp] | Freq: Every day | OPHTHALMIC | Status: DC | PRN

## 2023-09-25 MED ORDER — ROCURONIUM BROMIDE 10 MG/ML (PF) SYRINGE
PREFILLED_SYRINGE | INTRAVENOUS | Status: AC
  Filled 2023-09-25: qty 10

## 2023-09-25 MED ORDER — LIDOCAINE 2% (20 MG/ML) 5 ML SYRINGE
INTRAMUSCULAR | Status: DC | PRN
  Administered 2023-09-25: 100 mg via INTRAVENOUS

## 2023-09-25 MED ORDER — DOCUSATE SODIUM 100 MG PO CAPS
100.0000 mg | ORAL_CAPSULE | Freq: Two times a day (BID) | ORAL | Status: DC
  Administered 2023-09-25 – 2023-09-29 (×5): 100 mg via ORAL
  Filled 2023-09-25 (×5): qty 1

## 2023-09-25 MED ORDER — BACITRACIN ZINC 500 UNIT/GM EX OINT
TOPICAL_OINTMENT | CUTANEOUS | Status: AC
  Filled 2023-09-25: qty 28.35

## 2023-09-25 MED ORDER — FENTANYL CITRATE (PF) 100 MCG/2ML IJ SOLN
INTRAMUSCULAR | Status: DC | PRN
Start: 2023-09-25 — End: 2023-09-25
  Administered 2023-09-25: 100 ug via INTRAVENOUS

## 2023-09-25 MED ORDER — LIDOCAINE-EPINEPHRINE 1 %-1:100000 IJ SOLN
INTRAMUSCULAR | Status: DC | PRN
  Administered 2023-09-25: 10 mL

## 2023-09-25 MED ORDER — POLYETHYLENE GLYCOL 3350 17 G PO PACK: 17.0000 g | PACK | Freq: Every day | ORAL | Status: DC | PRN

## 2023-09-25 MED ORDER — CHLORHEXIDINE GLUCONATE 0.12 % MT SOLN
15.0000 mL | Freq: Once | OROMUCOSAL | Status: AC
  Administered 2023-09-25: 15 mL via OROMUCOSAL
  Filled 2023-09-25: qty 15

## 2023-09-25 MED ORDER — DIPHENHYDRAMINE HCL 50 MG/ML IJ SOLN
INTRAMUSCULAR | Status: DC | PRN
Start: 2023-09-25 — End: 2023-09-25
  Administered 2023-09-25: 12.5 mg via INTRAVENOUS

## 2023-09-25 MED ORDER — ACETAMINOPHEN 10 MG/ML IV SOLN
INTRAVENOUS | Status: DC | PRN
Start: 2023-09-25 — End: 2023-09-25
  Administered 2023-09-25: 1000 mg via INTRAVENOUS

## 2023-09-25 MED ORDER — VANCOMYCIN HCL 1500 MG/300ML IV SOLN: 1500.0000 mg | INTRAVENOUS | Status: DC

## 2023-09-25 MED ORDER — FENTANYL CITRATE (PF) 100 MCG/2ML IJ SOLN
25.0000 ug | INTRAMUSCULAR | Status: DC | PRN
  Administered 2023-09-25: 50 ug via INTRAVENOUS
  Administered 2023-09-25 (×2): 25 ug via INTRAVENOUS
  Administered 2023-09-25: 50 ug via INTRAVENOUS

## 2023-09-25 MED ORDER — ESTRADIOL 0.5 MG PO TABS
0.5000 mg | ORAL_TABLET | Freq: Every day | ORAL | Status: DC
  Administered 2023-09-25 – 2023-09-28 (×4): 0.5 mg via ORAL
  Filled 2023-09-25 (×5): qty 1

## 2023-09-25 MED ORDER — ONDANSETRON HCL 4 MG PO TABS
4.0000 mg | ORAL_TABLET | ORAL | Status: DC | PRN
  Administered 2023-09-27 (×3): 4 mg via ORAL
  Filled 2023-09-25 (×4): qty 1

## 2023-09-25 MED ORDER — OXYCODONE HCL 5 MG/5ML PO SOLN: 5.0000 mg | Freq: Once | ORAL | Status: DC | PRN

## 2023-09-25 MED ORDER — PANTOPRAZOLE SODIUM 40 MG PO TBEC
40.0000 mg | DELAYED_RELEASE_TABLET | Freq: Every day | ORAL | Status: DC
  Administered 2023-09-26 – 2023-09-29 (×4): 40 mg via ORAL
  Filled 2023-09-25 (×4): qty 1

## 2023-09-25 MED ORDER — ACETAMINOPHEN 325 MG PO TABS
650.0000 mg | ORAL_TABLET | ORAL | Status: DC | PRN
  Administered 2023-09-25 – 2023-09-28 (×8): 650 mg via ORAL
  Filled 2023-09-25 (×8): qty 2

## 2023-09-25 MED ORDER — HYDROMORPHONE HCL 1 MG/ML IJ SOLN
INTRAMUSCULAR | Status: AC
  Filled 2023-09-25: qty 1

## 2023-09-25 MED ORDER — FENTANYL CITRATE (PF) 250 MCG/5ML IJ SOLN: INTRAMUSCULAR | Status: DC | PRN

## 2023-09-25 MED ORDER — PHENYLEPHRINE 80 MCG/ML (10ML) SYRINGE FOR IV PUSH (FOR BLOOD PRESSURE SUPPORT)
PREFILLED_SYRINGE | INTRAVENOUS | Status: DC | PRN
  Administered 2023-09-25 (×2): 40 ug via INTRAVENOUS

## 2023-09-25 MED ORDER — SODIUM CHLORIDE 0.9 % IV SOLN: 0.1500 ug/kg/min | INTRAVENOUS | Status: DC

## 2023-09-25 MED ORDER — SPIRONOLACTONE 25 MG PO TABS
50.0000 mg | ORAL_TABLET | Freq: Every morning | ORAL | Status: DC
  Administered 2023-09-26: 50 mg via ORAL
  Filled 2023-09-25: qty 2

## 2023-09-25 SURGICAL SUPPLY — 135 items
APL SKNCLS STERI-STRIP NONHPOA (GAUZE/BANDAGES/DRESSINGS) ×2
ATTRACTOMAT 16X20 MAGNETIC DRP (DRAPES) ×2 IMPLANT
BAG COUNTER SPONGE SURGICOUNT (BAG) ×4 IMPLANT
BAG SPNG CNTER NS LX DISP (BAG) ×4
BENZOIN TINCTURE PRP APPL 2/3 (GAUZE/BANDAGES/DRESSINGS) ×2 IMPLANT
BETADINE 5% OPHTHALMIC (OPHTHALMIC) ×4 IMPLANT
BLADE INF TURB ROT M4 2 5PK (BLADE) ×2 IMPLANT
BLADE NAVIG QUADCUT 4.3X13 M4 (BLADE) ×2 IMPLANT
BLADE RAD40 ROTATE 4M 4 5PK (BLADE) IMPLANT
BLADE SURG 10 STRL SS (BLADE) ×2 IMPLANT
BLADE SURG 11 STRL SS (BLADE) ×4 IMPLANT
BLADE SURG 15 STRL LF DISP TIS (BLADE) ×6 IMPLANT
BLADE SURG 15 STRL SS (BLADE) ×6
BUR DIAMOND 13X5 70D (BURR) IMPLANT
BUR DIAMOND CURV 15X5 15D (BURR) IMPLANT
BUR TAPER CHOANAL ATRESIA 30K (BURR) ×3 IMPLANT
CABLE BIPOLOR RESECTION CORD (MISCELLANEOUS) ×2 IMPLANT
CANISTER SUCT 3000ML PPV (MISCELLANEOUS) ×6 IMPLANT
CATH LUMBAR HERMETIC 14G (CATHETERS) ×1 IMPLANT
CATHETER LUMBAR HERMETIC 14G (CATHETERS) ×2
CNTNR URN SCR LID CUP LEK RST (MISCELLANEOUS) ×2 IMPLANT
COAGULATOR SUCT SWTCH 10FR 6 (ELECTROSURGICAL) IMPLANT
CONT SPEC 4OZ STRL OR WHT (MISCELLANEOUS) ×2
COVER BACK TABLE 60X90IN (DRAPES) IMPLANT
COVER MAYO STAND STRL (DRAPES) ×2 IMPLANT
COVER SURGICAL LIGHT HANDLE (MISCELLANEOUS) ×2 IMPLANT
DEFOGGER MIRROR 1QT (MISCELLANEOUS) ×2 IMPLANT
DRAPE HALF SHEET 40X57 (DRAPES) ×4 IMPLANT
DRAPE INCISE IOBAN 66X45 STRL (DRAPES) ×2 IMPLANT
DRAPE MICROSCOPE SLANT 54X150 (MISCELLANEOUS) IMPLANT
DRAPE SURG 17X23 STRL (DRAPES) ×6 IMPLANT
DRESSING NASAL POPE 10X1.5X2.5 (GAUZE/BANDAGES/DRESSINGS) IMPLANT
DRSG NASAL POPE 10X1.5X2.5 (GAUZE/BANDAGES/DRESSINGS)
DRSG TEGADERM 2-3/8X2-3/4 SM (GAUZE/BANDAGES/DRESSINGS) IMPLANT
DRSG TELFA 3X8 NADH STRL (GAUZE/BANDAGES/DRESSINGS) IMPLANT
DURAPREP 26ML APPLICATOR (WOUND CARE) ×2 IMPLANT
ELECT COATED BLADE 2.86 ST (ELECTRODE) ×2 IMPLANT
ELECT NDL TIP 2.8 STRL (NEEDLE) ×1 IMPLANT
ELECT NEEDLE TIP 2.8 STRL (NEEDLE) ×2
ELECT REM PT RETURN 9FT ADLT (ELECTROSURGICAL) ×4
ELECTRODE NDL INSULATED 6.5 (ELECTROSURGICAL) IMPLANT
ELECTRODE REM PT RTRN 9FT ADLT (ELECTROSURGICAL) ×2 IMPLANT
GAUZE 4X4 16PLY ~~LOC~~+RFID DBL (SPONGE) IMPLANT
GAUZE PACKING FOLDED 2 STR (GAUZE/BANDAGES/DRESSINGS) ×2 IMPLANT
GAUZE SPONGE 2X2 8PLY STRL LF (GAUZE/BANDAGES/DRESSINGS) ×4 IMPLANT
GAUZE SPONGE 4X4 12PLY STRL (GAUZE/BANDAGES/DRESSINGS) ×2 IMPLANT
GLOVE BIO SURGEON STRL SZ 6.5 (GLOVE) ×2 IMPLANT
GLOVE BIO SURGEON STRL SZ7.5 (GLOVE) ×2 IMPLANT
GLOVE BIOGEL PI IND STRL 7.5 (GLOVE) ×4 IMPLANT
GLOVE ECLIPSE 7.5 STRL STRAW (GLOVE) ×2 IMPLANT
GLOVE EXAM NITRILE LRG STRL (GLOVE) IMPLANT
GLOVE EXAM NITRILE XL STR (GLOVE) IMPLANT
GLOVE EXAM NITRILE XS STR PU (GLOVE) IMPLANT
GLOVE SURG LTX SZ7.5 (GLOVE) ×2 IMPLANT
GOWN STRL REUS W/ TWL LRG LVL3 (GOWN DISPOSABLE) ×4 IMPLANT
GOWN STRL REUS W/ TWL XL LVL3 (GOWN DISPOSABLE) IMPLANT
GOWN STRL REUS W/TWL 2XL LVL3 (GOWN DISPOSABLE) ×2 IMPLANT
GOWN STRL REUS W/TWL LRG LVL3 (GOWN DISPOSABLE) ×4
GOWN STRL REUS W/TWL XL LVL3 (GOWN DISPOSABLE)
HEMOSTAT ARISTA ABSORB 3G PWDR (HEMOSTASIS) IMPLANT
HEMOSTAT POWDER KIT SURGIFOAM (HEMOSTASIS) ×2 IMPLANT
HEMOSTAT SURGICEL 2X14 (HEMOSTASIS) IMPLANT
IV NS 1000ML (IV SOLUTION) ×4
IV NS 1000ML BAXH (IV SOLUTION) ×4 IMPLANT
KIT BASIN OR (CUSTOM PROCEDURE TRAY) ×4 IMPLANT
KIT DRAIN CSF ACCUDRAIN (MISCELLANEOUS) ×1 IMPLANT
KIT TURNOVER KIT B (KITS) ×4 IMPLANT
KNIFE ARACHNOID DISP AM-21-S (BLADE) IMPLANT
MARKER SKIN DUAL TIP RULER LAB (MISCELLANEOUS) ×2 IMPLANT
NDL HYPO 18GX1.5 BLUNT FILL (NEEDLE) ×1 IMPLANT
NDL HYPO 25GX1X1/2 BEV (NEEDLE) ×2 IMPLANT
NDL HYPO 25X1 1.5 SAFETY (NEEDLE) ×1 IMPLANT
NDL SPNL 22GX3.5 QUINCKE BK (NEEDLE) ×1 IMPLANT
NDL SPNL 25GX3.5 QUINCKE BL (NEEDLE) ×1 IMPLANT
NEEDLE HYPO 18GX1.5 BLUNT FILL (NEEDLE) ×2
NEEDLE HYPO 25GX1X1/2 BEV (NEEDLE) ×4
NEEDLE HYPO 25X1 1.5 SAFETY (NEEDLE) ×2
NEEDLE SPNL 22GX3.5 QUINCKE BK (NEEDLE) ×2
NEEDLE SPNL 25GX3.5 QUINCKE BL (NEEDLE) ×2
NS IRRIG 1000ML POUR BTL (IV SOLUTION) ×4 IMPLANT
OPHTHALMIC BETADINE 5% (OPHTHALMIC) ×4
PACK LAMINECTOMY NEURO (CUSTOM PROCEDURE TRAY) ×1 IMPLANT
PAD ARMBOARD 7.5X6 YLW CONV (MISCELLANEOUS) ×6 IMPLANT
PATTIES SURGICAL .25X.25 (GAUZE/BANDAGES/DRESSINGS) IMPLANT
PATTIES SURGICAL .5 X.5 (GAUZE/BANDAGES/DRESSINGS) ×2 IMPLANT
PATTIES SURGICAL .5 X3 (DISPOSABLE) ×4 IMPLANT
PENCIL BUTTON HOLSTER BLD 10FT (ELECTRODE) ×2 IMPLANT
PENCIL SMOKE EVACUATOR (MISCELLANEOUS) ×2 IMPLANT
POSITIONER HEAD DONUT 9IN (MISCELLANEOUS) ×2 IMPLANT
PROBE FOR NEUROSURGERY (MISCELLANEOUS) IMPLANT
SEALANT ADHERUS EXTEND TIP (MISCELLANEOUS) ×1 IMPLANT
SHEATH ENDOSCRUB 0 DEG (SHEATH) ×2 IMPLANT
SHEATH ENDOSCRUB 30 DEG (SHEATH) IMPLANT
SHEATH ENDOSCRUB 45 DEG (SHEATH) IMPLANT
SOL ANTI FOG 6CC (MISCELLANEOUS) IMPLANT
SOL ELECTROSURG ANTI STICK (MISCELLANEOUS) ×2
SOLUTION ELECTROSURG ANTI STCK (MISCELLANEOUS) IMPLANT
SPECIMEN JAR SMALL (MISCELLANEOUS) IMPLANT
SPLINT NASAL DOYLE BI-VL (GAUZE/BANDAGES/DRESSINGS) ×2 IMPLANT
SPLINT NASAL POSISEP X .6X2 (GAUZE/BANDAGES/DRESSINGS) IMPLANT
SPLINT NASAL POSISEP X2 .8X2.3 (GAUZE/BANDAGES/DRESSINGS) ×1 IMPLANT
SPONGE SURGIFOAM ABS GEL SZ50 (HEMOSTASIS) IMPLANT
SPONGE T-LAP 18X18 ~~LOC~~+RFID (SPONGE) IMPLANT
SPONGE T-LAP 4X18 ~~LOC~~+RFID (SPONGE) ×2 IMPLANT
STAPLER SKIN PROX WIDE 3.9 (STAPLE) ×2 IMPLANT
STAPLER VISISTAT 35W (STAPLE) ×2 IMPLANT
STRIP CLOSURE SKIN 1/2X4 (GAUZE/BANDAGES/DRESSINGS) ×2 IMPLANT
SUCTION TUBE FRAZIER 10FR DISP (SUCTIONS) ×2 IMPLANT
SUT BONE WAX W31G (SUTURE) ×1 IMPLANT
SUT CHROMIC 4 0 P 3 18 (SUTURE) ×1 IMPLANT
SUT ETHILON 3 0 FSL (SUTURE) IMPLANT
SUT ETHILON 3 0 PS 1 (SUTURE) IMPLANT
SUT ETHILON 6 0 P 1 (SUTURE) IMPLANT
SUT PDS AB 4-0 RB1 27 (SUTURE) IMPLANT
SUT PDS PLUS AB 5-0 RB-1 (SUTURE) IMPLANT
SUT PLAIN 4 0 ~~LOC~~ 1 (SUTURE) ×2 IMPLANT
SUT SILK 2 0 SH (SUTURE) ×2 IMPLANT
SUT SILK 3 0 SH 30 (SUTURE) ×1 IMPLANT
SUT VIC AB 4-0 P-3 18X BRD (SUTURE) IMPLANT
SUT VIC AB 4-0 P3 18 (SUTURE)
SYR CONTROL 10ML LL (SYRINGE) ×2 IMPLANT
SYR TB 1ML LUER SLIP (SYRINGE) ×4 IMPLANT
TOWEL GREEN STERILE (TOWEL DISPOSABLE) ×2 IMPLANT
TOWEL GREEN STERILE FF (TOWEL DISPOSABLE) ×4 IMPLANT
TRACKER ENT INSTRUMENT (MISCELLANEOUS) ×4 IMPLANT
TRACKER ENT PATIENT (MISCELLANEOUS) ×2 IMPLANT
TRAP SPECIMEN MUCUS 40CC (MISCELLANEOUS) IMPLANT
TRAY ENT MC OR (CUSTOM PROCEDURE TRAY) ×3 IMPLANT
TRAY FOLEY MTR SLVR 16FR STAT (SET/KITS/TRAYS/PACK) ×1 IMPLANT
TUBE CONNECTING 12X1/4 (SUCTIONS) ×2 IMPLANT
TUBE SALEM SUMP 16F (TUBING) ×2 IMPLANT
TUBING EXTENTION W/L.L. (IV SETS) ×2 IMPLANT
TUBING FEATHERFLOW (TUBING) IMPLANT
TUBING STRAIGHTSHOT EPS 5PK (TUBING) ×2 IMPLANT
WATER STERILE IRR 1000ML POUR (IV SOLUTION) ×4 IMPLANT

## 2023-09-25 NOTE — Discharge Instructions (Signed)
[facility] ([facility]) Post Operative Instructions  Office: [phone]  The Surgery Itself Endoscopic sinus surgery (with or without septoplasty and turbinate reduction) involves general anesthesia, typically for one to two hours. Patients may be sedated for several hours after surgery and may remain sleepy for the better part of the day. Nausea and vomiting are occasionally seen, and usually resolve by the evening of surgery - even without additional medications. Almost all patients can go home the day of surgery.  After Surgery  Facial pressure and fullness similar to a sinus infection/headache is normal after surgery. Breathing through your nose is also difficult due to swelling. A humidifier or vaporizer can be used in the bedroom to prevent throat pain with mouth breathing.   Bloody nasal drainage is normal after this surgery for 5-7 days, usually decreasing in volume with each day that passes. Drainage will flow from the front of the nose and down the back of the throat. Make sure you spit out blood drainage that drips down the back of your throat to prevent nausea/vomiting. You will have a nasal drip pad/sling with gauze to catch drainage from the front of your nose. The dressing may need to be changed frequently during the first 24 hours following surgery. In case of profuse nasal bleeding, you may apply ice to the bridge of the nose and pinch the nose just above the tip and hold for 10 minutes; if bleeding continues, contact the doctors office.   Frequent hot showers or saline nasal rinses (NeilMed) will help break up congestion and clear any clot or mucus that builds up within the nose after surgery. This can be started the day after surgery.   It is more comfortable to sleep with extra pillows or in a recliner for the first few days after surgery until the drainage begins to resolve.    Do not blow your nose for 2 weeks after surgery.   Avoid lifting > 10 lbs. and no  vigorous exercise for 2 weeks after surgery.   Avoid airplane travel for 2 weeks following sinus surgery; the cabin pressure changes can cause pain and swelling within the nose/sinuses.   Sense of smell and taste are often diminished for several weeks after surgery. There may be some tenderness or numbness in your upper front teeth, which is normal after surgery. You may express old clot, discolored mucus or very large nasal crusts from your nose for up to 3-4 weeks after surgery; depending on how frequently and how effectively you irrigate your nose with the saltwater spray.   You may have absorbable sutures inside of your nose after surgery that will slowly dissolve in 2-3 weeks. Be careful when clearing crusts from the nose since they may be attached to these sutures.  Medications  Pain medication can be used for pain as prescribed. Pain and pressure in the nose is expected after surgery. As the surgical site heals, pain will resolve over the course of a week. Pain medications can cause nausea, which can be prevented if you take them with food or milk.   You may be given an antibiotic for one week after surgery to prevent infection. Take this medication with food to prevent nausea or vomiting.   You can use 2 nasal sprays after surgery: Afrin can be used up to 2 times a day for up to 5 days after surgery (best before bed) to reduce bloody drainage from the nose for the first few days after surgery. Saline/salt   water spray should be used at least 4-6 times per day, starting the day after surgery to prevent crusting inside of the nose.   Take all of your routine medications as prescribed, unless told otherwise by your surgeon. Any medications that thin the blood should be avoided. This includes aspirin. Avoid aspirin-like products for the first 72 hours after surgery (Advil, Motrin, Excedrin, Alieve, Celebrex, Naprosyn), but you may use them as needed for pain after 72 hours.   

## 2023-09-25 NOTE — H&P (Signed)
Surgical H&P Update  HPI: 57 y.o. with a history of acromegaly noticed on clinical exam. Workup showed a pituitary adenoma. No changes in health since they were last seen.   PMHx:  Past Medical History:  Diagnosis Date   Acne    Acromegaly (HCC)    Anemia    Anxiety    Arthritis    Dyslipidemia    Family history of adverse reaction to anesthesia    mother- N/V , father- slow to wake up   Fibrocystic breast disease in female    Fibromyalgia    GERD (gastroesophageal reflux disease)    Hidradenitis    Migraine    Multinodular goiter    Pt denies   PMDD (premenstrual dysphoric disorder)    PONV (postoperative nausea and vomiting)    Sleep apnea    has cpap   FamHx: History reviewed. No pertinent family history. SocHx:  reports that she has never smoked. She has never used smokeless tobacco. She reports that she does not currently use alcohol. She reports that she does not use drugs.  Physical Exam: Aox3, gaze conjugate, VFF to confrontation, stable typical acromegalic features  Assesment/Plan: 57 y.o. 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 Risks, benefits, and alternatives discussed and the patient would like to continue with surgery.  -OR today -4N ICU post-op  Jadene Pierini, MD 09/25/23 8:23 AM

## 2023-09-25 NOTE — Transfer of Care (Signed)
Immediate Anesthesia Transfer of Care Note  Patient: Rebecca Vargas  Procedure(s) Performed: Endonasal endoscopic resection of pituitary mass (Nose) TRANSPHENOIDAL APPROACH EXPOSURE WITH FUSION (Nose) SEPTOPLASTY (Nose)  Patient Location: PACU  Anesthesia Type:General  Level of Consciousness: drowsy  Airway & Oxygen Therapy: Patient Spontanous Breathing and Patient connected to face mask oxygen  Post-op Assessment: Report given to RN, Post -op Vital signs reviewed and stable, and Patient moving all extremities X 4  Post vital signs: Reviewed and stable  Last Vitals:  Vitals Value Taken Time  BP 124/86 09/25/23 1211  Temp    Pulse 102 09/25/23 1214  Resp 16 09/25/23 1214  SpO2 92 % 09/25/23 1214  Vitals shown include unfiled device data.  Last Pain:  Vitals:   09/25/23 0739  TempSrc:   PainSc: 0-No pain         Complications: No notable events documented.

## 2023-09-25 NOTE — Anesthesia Procedure Notes (Signed)
Arterial Line Insertion Start/End10/18/2024 8:00 AM, 09/25/2023 8:05 AM Performed by: Randon Goldsmith, CRNA, CRNA  Patient location: Pre-op. Preanesthetic checklist: patient identified, IV checked, site marked, risks and benefits discussed, surgical consent, monitors and equipment checked, pre-op evaluation, timeout performed and anesthesia consent Lidocaine 1% used for infiltration Left, radial was placed Catheter size: 20 G Hand hygiene performed , maximum sterile barriers used  and Seldinger technique used Allen's test indicative of satisfactory collateral circulation Attempts: 2 Procedure performed using ultrasound guided technique. Ultrasound Notes:anatomy identified, needle tip was noted to be adjacent to the nerve/plexus identified and no ultrasound evidence of intravascular and/or intraneural injection Following insertion, dressing applied. Post procedure assessment: normal and unchanged  Patient tolerated the procedure well with no immediate complications.

## 2023-09-25 NOTE — Progress Notes (Incomplete)
Neurosurgery Service Post-operative progress note  Assessment & Plan: 57 y.o. woman s/p endonasal resection of pituitary adenoma w/ acromegaly, seen in PACU, MAEx4, vision grossly stable to preop, recovering well.  -daily IGF-1 and RFP while inpatient -ICU overnight -if greater than 250cc of urine output for 2 consecutive hours, order RFP and check specific gravity and call neurosurgery on-call  -strict I/O overnight -regular diet -no nose blowing -do not use home CPAP -***  Rebecca Vargas

## 2023-09-25 NOTE — Plan of Care (Signed)
[facility] Tonsillectomy/Adenoidectomy, Postoperative PEDS care plan entered in error.

## 2023-09-25 NOTE — Anesthesia Postprocedure Evaluation (Signed)
Anesthesia Post Note  Patient: Rebecca Vargas  Procedure(s) Performed: Endonasal endoscopic resection of pituitary mass (Nose) TRANSPHENOIDAL APPROACH EXPOSURE WITH FUSION (Nose) SEPTOPLASTY (Nose)     Patient location during evaluation: PACU Anesthesia Type: General Level of consciousness: awake and alert Pain management: pain level controlled Vital Signs Assessment: post-procedure vital signs reviewed and stable Respiratory status: spontaneous breathing, nonlabored ventilation, respiratory function stable and patient connected to nasal cannula oxygen Cardiovascular status: blood pressure returned to baseline and stable Postop Assessment: no apparent nausea or vomiting Anesthetic complications: no   No notable events documented.  Last Vitals:  Vitals:   09/25/23 1215 09/25/23 1230  BP: 120/89 114/81  Pulse: 99 98  Resp: 17 (!) 21  Temp:    SpO2: 92% 94%    Last Pain:  Vitals:   09/25/23 1230  TempSrc:   PainSc: 8                  [place]

## 2023-09-25 NOTE — Progress Notes (Signed)
PATIENT: Rebecca Vargas   DAY OF SURGERY: 09/25/23   PRE-OPERATIVE DIAGNOSIS:  Pituitary adenoma with acromegaly   POST-OPERATIVE DIAGNOSIS:  Pituitary adenoma with acromegaly   PROCEDURE:  Endonasal endoscopic transphenoidal resection of pituitary tumor   SURGEON:  Surgeon(s) and Role:    Jadene Pierini, MD - Co-surgeon    Skotnicki, Lindie Spruce, MD - Co-surgeon   ANESTHESIA: ETGA   BRIEF HISTORY: This is a 57 year old woman who was diagnosed with acromegaly clinically due to typical physical features, IGF-1 was elevated and over 1600 and MRI confirmed a pituitary adenoma. We discussed the above and I recommended surgical resection. This was discussed with the patient as well as risks, benefits, and alternatives and wished to proceed with surgical treatment.   OPERATIVE DETAIL: Dr. Marene Lenz performed the nasal portion of the approach and is dictated separately.    For the neurosurgical portion of the case, Dr. Marene Lenz and I used a combination of visual anatomic landmarks and frameless stereotaxy to confirm orientation and anatomy. The remainder of the rostral face of the sella was resected. The dura of the sella was then opened sharply with a #15 blade in a cruciate fashion. Tumor was immediately encountered and samples were taken and sent to pathology for analysis. With Dr. Anders Simmonds assistance while holding the endoscope, the tumor was then resected in a deliberate fashion with bimanual technique using a combination of ring curettes, suction, and penfield #4. The tumor produced itself due to pressure through the durotomy and multiple samples were sent to pathology. It was then further resected until the only residual was what appeared to be normal gland in the left posterior aspect of the sella. Its appearance was grossly consistent with normal gland and appeared to be connected to the diaphragma at the stalk, so this was obviously left in place. At the end of resection, there was  pulsatile diaphragma in the field. There was no evidence of CSF leakage. Hemostasis was confirmed, floseal was used to gently fill the sella and Adheris ([employer]) was used to cover the sellar defect. Dr. Marene Lenz then took over to complete the skull base reconstruction portion of the procedure.    EBL:  50mL   DRAINS: none   SPECIMENS: Pituitary tumor   Jadene Pierini, MD 09/25/23 8:50 AM

## 2023-09-25 NOTE — Anesthesia Procedure Notes (Signed)
Procedure Name: Intubation Date/Time: 09/25/2023 9:06 AM  Performed by: Garfield Cornea, CRNAPre-anesthesia Checklist: Patient identified, Emergency Drugs available, Suction available and Patient being monitored Patient Re-evaluated:Patient Re-evaluated prior to induction Oxygen Delivery Method: Circle System Utilized Preoxygenation: Pre-oxygenation with 100% oxygen Induction Type: IV induction Ventilation: Mask ventilation without difficulty Laryngoscope Size: Mac and 3 Grade View: Grade I Tube type: Oral Tube size: 7.0 mm Number of attempts: 1 Airway Equipment and Method: Stylet Placement Confirmation: ETT inserted through vocal cords under direct vision, positive ETCO2 and breath sounds checked- equal and bilateral Secured at: 23 cm Tube secured with: Tape Dental Injury: Teeth and Oropharynx as per pre-operative assessment

## 2023-09-25 NOTE — Op Note (Signed)
OPERATIVE NOTE  Rebecca Vargas Date/Time of Admission: 09/25/2023  6:38 AM  CSN: [id number];WUJ:[id number] Attending Provider: Jadene Pierini, MD Room/Bed: MCPO/NONE DOB: [birth year] Age: 57 y.o.   Pre-Op Diagnosis: Pituitary tumor; Nasal septal deviation  Post-Op Diagnosis: Pituitary tumor; Nasal septal deviation  Procedure: Procedure(s): Endonasal endoscopic resection of pituitary mass ENDOSCOPIC ENDONASAL TRANSPHENOIDAL RESECTION OF PITUITARY TUMOR SEPTOPLASTY  Anesthesia: General  Surgeon(s): Bayden Gil A Morrissa Shein, DO - Co=surgeon Autumn Patty, MD - Co-surgeon   Staff: Circulator: Cox, Anson Fret, RN; Babs Sciara, RN Scrub Person: Ammie Ferrier, Makenzie Vendor Representative : Debbe Mounts Circulator Assistant: Rogers Seeds, RN  Implants: * No implants in log *  Specimens: ID Type Source Tests Collected by Time Destination  1 : Pituitary Tumor Tissue PATH Soft tissue SURGICAL PATHOLOGY Jadene Pierini, MD 09/25/2023 1103     Complications: None  EBL: 50 ML  Condition: stable  Operative Findings:  Right septal deviation with bony spurring, large pituitary tumor. No evidence of CSF leak after tumor resection.  Description of Operation: Once operative consent was obtained and the site and surgery were confirmed with the patient and the operating room team, the patient was brought back to the operating room and general endotracheal anesthesia was obtained. The patient was then turned over to the ENT service, at which time the image-guided system was attached and noted to be in good calibration. Lidocaine 1% with 1:100,000 epinephrine was injected into the nasal septum bilaterally, inferior turbinates bilaterally, the middle turbinates bilaterally, and the axilla between the medial turbinate and the lateral nasal wall. Afrin-soaked pledgets were placed into the nasal cavity, and the patient was prepped and draped in sterile fashion. The  rigid scope was placed in the nasal cavities and due to difficulty visualizing the right sphenoid os secondary to septal deviation, decision was made to proceed with septoplasty. Attention was first turned to the left nasal vestibule. A left sided hemi-transfixion incision was made a submucoperichondrial flap was elevated on the left.  A submucous resection of  nasal septal cartilage was performed with care taken to leave a 1 cm caudal and dorsal strut. In doing this, a right-sided submucoperichondrial flap was elevated.  The bony nasal septum was addressed by lifting up the soft tissues, separating the superior septum with a double action scissor and then removing the inferior deflected portion. The nasal spine was removed using the Risk analyst and Lenoria Chime. With this completed, the nasal septum was midline. Attention was then turned to the right nasal vestibule.  The inferior and middle turbinate were gently lateralized using a Cottle elevator until the superior turbinate was identified.  This was also lateralized until the natural sinus ostium was appreciated.  This was serially dilated using a mushroom punch and an up-biting Kerrison.  A posterior septectomy was then performed using a combination of a Cottle elevator, Tru-Cut forceps and Kerrisons.  The nasoseptal flap pedicle was carefully preserved. The left middle turbinate and superior turbinate were then identified and gently lateralized until the natural sphenoid os was identified and serially dilated using the sphenoid dilator, mushroom punch and up-biting Kerrisons.  With the bilateral sphenoid cavities in view, the remaining bone of the sphenoid face was removed using Kerrison forceps.  A diamond bur drill was also used to complete the bony resection and to remove the intrasinus septum until the bone overlying the pituitary adenoma was completely visualized.  This bone was removed using Kerrison forceps until the tumor was completely visualized.  Dr. Johnsie Cancel then scrubbed in and completely resected the tumor, please see his operative note for details regarding the repair.  There was no evidence of CSF leak following tumor removal. Floseal was placed in the defect and Adheris was used to completely cover the defect.  Posisep nasal packing was placed in bilateral nasal cavities in the sphenoid defect. The submucoperichondrial flaps were returned to their anatomic position and hemi-transfixion incision was closed with interrupted 4-0 chromic gut. A 4-0 plain gut suture was used to perform a mattress style stitch in a circular direction from anterior to posterior septum to re-approximate the right and left sided flaps.  Doyle nasal splints were placed in the bilateral nasal cavities and sutured to the columella with a 3-0 silk suture and a nasal drip pad was applied. An orogastric tube was placed and the stomach cavity was suctioned to reduce postoperative nausea. The patient was turned over to anesthesia service and was extubated in the operating room and transferred to the PACU in stable condition.    Laren Boom, DO [facility]  09/25/2023

## 2023-09-25 NOTE — Consult Note (Signed)
NAME:  TYREONNA LIVING, MRN:  [id number], DOB:  [birth year], LOS: 0 ADMISSION DATE:  09/25/2023, CONSULTATION DATE:  09/25/2023 REFERRING MD:  Maurice Small - CNSA, CHIEF COMPLAINT:  diabetes care post op.   History of Present Illness:  57 year old woman who presented with progressive symptoms of acromegaly for the past several years. She complains of fatigue, joint and bone pain.  Excessive sweating.  She has noted increase in her shoe size and her dentist noticed prognathia.  Outpatient GH and IGF 1 levels were elevated under oral glucose tolerance test showed persistently elevated growth hormone levels.  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  She has sleep apnea and type 2 diabetes.  Pertinent  Medical History   Past Medical History:  Diagnosis Date   Acne    Acromegaly (HCC)    Anemia    Anxiety    Arthritis    Dyslipidemia    Family history of adverse reaction to anesthesia    mother- N/V , father- slow to wake up   Fibrocystic breast disease in female    Fibromyalgia    GERD (gastroesophageal reflux disease)    Hidradenitis    Migraine    Multinodular goiter    Pt denies   PMDD (premenstrual dysphoric disorder)    PONV (postoperative nausea and vomiting)    Sleep apnea    has cpap   Past Surgical History:  Procedure Laterality Date   CARPAL TUNNEL RELEASE  2006   CHOLECYSTECTOMY     HERNIA REPAIR     Age 10   POSTERIOR LUMBAR FUSION 4 LEVEL N/A 02/05/2023   Procedure: Open Thoracic Ten-Pelvis Instrumentation and Fusion; Lumbar One-Sacral One Posterior Column Osteotomies; Lumbar Two-Three, Lumbar Three-Four, Lumbar Four-Five, Lumbar Five-Sacral One Transforaminal Lumbar Interbody Fusions;  Surgeon: Dawley, Alan Mulder, DO;  Location: [facility] OR;  Service: Neurosurgery;  Laterality: N/A;   TOTAL HIP ARTHROPLASTY Right 02/12/2021   Procedure: TOTAL HIP ARTHROPLASTY ANTERIOR APPROACH;  Surgeon: Marcene Corning, MD;   Location: [facility] ORS;  Service: Orthopedics;  Laterality: Right;   TOTAL HIP ARTHROPLASTY Left 04/22/2022   Procedure: LEFT TOTAL HIP ARTHROPLASTY ANTERIOR APPROACH;  Surgeon: Marcene Corning, MD;  Location: [facility] ORS;  Service: Orthopedics;  Laterality: Left;   WISDOM TOOTH EXTRACTION       Significant Hospital Events: Including procedures, antibiotic start and stop dates in addition to other pertinent events   10/18 endoscopic endonasal transsphenoidal resection of pituitary tumor with septoplasty  Interim History / Subjective:  Complains of frontal headache and nausea. Doesn't feel she can take in oral intake.  Denies nasal drainage.   Objective   Blood pressure (!) 153/94, pulse 91, temperature 98.1 F (36.7 C), resp. rate 13, height 5\' 6"  (1.676 m), weight 111 kg, SpO2 96%.        Intake/Output Summary (Last 24 hours) at 09/25/2023 1459 Last data filed at 09/25/2023 1417 Gross per 24 hour  Intake 600 ml  Output 1200 ml  Net -600 ml   Filed Weights   09/25/23 0650  Weight: 111 kg    Examination: General: obese woman, appears uncomfortable.  HENT: nasal packing in place.  Lungs: clear bilaterally  Cardiovascular: HS normal with warm extremities.  Abdomen: soft with no tenderness Extremities: no edema or active joints  Neuro: non-focal exam.  GU: Foley in place.   Ancillary tests personally reviewed  Preoperative labs show sodium 138, normal creatinine 0.64.  Normal CBC Mildly elevated hemoglobin A1c 6.2  Assessment & Plan:  Acromegaly status post transsphenoidal pituitary tumor resection. Type 2 diabetes Obstructive sleep apnea Hypertension.  Plan:  -ICU monitoring for excessive urine output, CSF leak.  -At risk for DI. Suspect if U/O > 250 ml/h for 2hr. Consider DDAVP at that time. -Monitor for evidence of obstruction at nighttime.  Cannot use CPAP immediately postsurgery.  Limit narcotic use. Multimodal pain control.  -Sliding scale insulin. -Resume home blood  pressure medications. Prn labetalol, target SBP <150. - IV fluids as may not drink much overnight and at risk for polyuria.   Best Practice (right click and "Reselect all SmartList Selections" daily)   Diet/type: Regular consistency (see orders) DVT prophylaxis: prophylactic heparin  GI prophylaxis: N/A Lines: N/A Foley:  removal ordered  Code Status:  full code Last date of multidisciplinary goals of care discussion [per neurosurgery.]  Lynnell Catalan, MD [facility] ICU Physician [facility]  Pager: [phone] Or Epic Secure Chat After hours: [phone].  09/25/2023, 2:59 PM

## 2023-09-26 ENCOUNTER — Encounter (HOSPITAL_COMMUNITY): Payer: Self-pay | Admitting: Neurological Surgery

## 2023-09-26 DIAGNOSIS — E220 Acromegaly and pituitary gigantism: Secondary | ICD-10-CM | POA: Diagnosis not present

## 2023-09-26 LAB — RENAL FUNCTION PANEL
Albumin: 3.1 g/dL — ABNORMAL LOW (ref 3.5–5.0)
Anion gap: 10 (ref 5–15)
BUN: 12 mg/dL (ref 6–20)
CO2: 21 mmol/L — ABNORMAL LOW (ref 22–32)
Calcium: 9 mg/dL (ref 8.9–10.3)
Chloride: 106 mmol/L (ref 98–111)
Creatinine, Ser: 0.57 mg/dL (ref 0.44–1.00)
GFR, Estimated: >60 mL/min (ref >60)
Glucose, Bld: 122 mg/dL — ABNORMAL HIGH (ref 70–99)
Phosphorus: 4.6 mg/dL (ref 2.5–4.6)
Potassium: 3.5 mmol/L (ref 3.5–5.1)
Sodium: 137 mmol/L (ref 135–145)

## 2023-09-26 LAB — OSMOLALITY, URINE: Osmolality, Ur: 212 mosm/kg — ABNORMAL LOW (ref 300–900)

## 2023-09-26 LAB — BASIC METABOLIC PANEL
Anion gap: 8 (ref 5–15)
BUN: 14 mg/dL (ref 6–20)
CO2: 25 mmol/L (ref 22–32)
Calcium: 9.2 mg/dL (ref 8.9–10.3)
Chloride: 110 mmol/L (ref 98–111)
Creatinine, Ser: 0.69 mg/dL (ref 0.44–1.00)
GFR, Estimated: >60 mL/min (ref >60)
Glucose, Bld: 109 mg/dL — ABNORMAL HIGH (ref 70–99)
Potassium: 3.7 mmol/L (ref 3.5–5.1)
Sodium: 143 mmol/L (ref 135–145)

## 2023-09-26 MED ORDER — SODIUM CHLORIDE 0.9% FLUSH
3.0000 mL | Freq: Two times a day (BID) | INTRAVENOUS | Status: DC
  Administered 2023-09-27 – 2023-09-29 (×4): 3 mL

## 2023-09-26 MED ORDER — DESMOPRESSIN ACETATE 4 MCG/ML IJ SOLN
1.0000 ug | Freq: Once | INTRAMUSCULAR | Status: AC
  Administered 2023-09-26: 1 ug via INTRAVENOUS
  Filled 2023-09-26: qty 1

## 2023-09-26 NOTE — Progress Notes (Signed)
Patient ID: Rebecca Vargas, female   DOB: 10-24-1966, 57 y.o.   MRN: [id number] Vital signs are stable Vi does not appear to be a problem Rebecca Vargas is being transferred to the floor She is feeling quite well save for some modest headache and nasal swelling.

## 2023-09-26 NOTE — Progress Notes (Signed)
NAME:  Rebecca Vargas, MRN:  [id number], DOB:  12/05/1966, LOS: 1 ADMISSION DATE:  09/25/2023, CONSULTATION DATE:  09/25/2023 REFERRING MD:  Maurice Small - CNSA, CHIEF COMPLAINT:  diabetes care post op.   History of Present Illness:  57 year old woman who presented with progressive symptoms of acromegaly for the past several years. She complains of fatigue, joint and bone pain.  Excessive sweating.  She has noted increase in her shoe size and her dentist noticed prognathia.  Outpatient GH and IGF 1 levels were elevated under oral glucose tolerance test showed persistently elevated growth hormone levels.  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  She has sleep apnea and type 2 diabetes.  Pertinent  Medical History   Past Medical History:  Diagnosis Date   Acne    Acromegaly (HCC)    Anemia    Anxiety    Arthritis    Dyslipidemia    Family history of adverse reaction to anesthesia    mother- N/V , father- slow to wake up   Fibrocystic breast disease in female    Fibromyalgia    GERD (gastroesophageal reflux disease)    Hidradenitis    Migraine    Multinodular goiter    Pt denies   PMDD (premenstrual dysphoric disorder)    PONV (postoperative nausea and vomiting)    Sleep apnea    has cpap   Past Surgical History:  Procedure Laterality Date   CARPAL TUNNEL RELEASE  2006   CHOLECYSTECTOMY     CRANIOTOMY N/A 09/25/2023   Procedure: Endonasal endoscopic resection of pituitary mass;  Surgeon: Jadene Pierini, MD;  Location: [facility] OR;  Service: Neurosurgery;  Laterality: N/A;   HERNIA REPAIR     Age 67   POSTERIOR LUMBAR FUSION 4 LEVEL N/A 02/05/2023   Procedure: Open Thoracic Ten-Pelvis Instrumentation and Fusion; Lumbar One-Sacral One Posterior Column Osteotomies; Lumbar Two-Three, Lumbar Three-Four, Lumbar Four-Five, Lumbar Five-Sacral One Transforaminal Lumbar Interbody Fusions;  Surgeon: Dawley, Alan Mulder, DO;   Location: [facility] OR;  Service: Neurosurgery;  Laterality: N/A;   SEPTOPLASTY N/A 09/25/2023   Procedure: SEPTOPLASTY;  Surgeon: Laren Boom, DO;  Location: [facility] OR;  Service: ENT;  Laterality: N/A;   TOTAL HIP ARTHROPLASTY Right 02/12/2021   Procedure: TOTAL HIP ARTHROPLASTY ANTERIOR APPROACH;  Surgeon: Marcene Corning, MD;  Location: [facility] ORS;  Service: Orthopedics;  Laterality: Right;   TOTAL HIP ARTHROPLASTY Left 04/22/2022   Procedure: LEFT TOTAL HIP ARTHROPLASTY ANTERIOR APPROACH;  Surgeon: Marcene Corning, MD;  Location: [facility] ORS;  Service: Orthopedics;  Laterality: Left;   TRANSPHENOIDAL APPROACH EXPOSURE N/A 09/25/2023   Procedure: TRANSPHENOIDAL APPROACH EXPOSURE WITH FUSION;  Surgeon: Laren Boom, DO;  Location: [facility] OR;  Service: ENT;  Laterality: N/A;   WISDOM TOOTH EXTRACTION       Significant Hospital Events: Including procedures, antibiotic start and stop dates in addition to other pertinent events   10/18 endoscopic endonasal transsphenoidal resection of pituitary tumor with septoplasty  Interim History / Subjective:  Headache and nausea have improved.  Pain well-controlled with Tylenol alone. Reasonable urine output.  Objective   Blood pressure 121/80, pulse 64, temperature (!) 96.8 F (36 C), temperature source Axillary, resp. rate 14, height 5\' 6"  (1.676 m), weight 111 kg, SpO2 93%.        Intake/Output Summary (Last 24 hours) at 09/26/2023 0853 Last data filed at 09/26/2023 0800 Gross per 24 hour  Intake 1749.54  ml  Output 2575 ml  Net -825.46 ml   Filed Weights   09/25/23 0650  Weight: 111 kg    Examination: General: obese woman, appears more comfortable than yesterday. HENT: nasal packing has been removed Lungs: clear bilaterally  Cardiovascular: HS normal with warm extremities.  Abdomen: soft with no tenderness Extremities: no edema or active joints  Neuro: non-focal exam.  GU: Foley in place.   Ancillary tests personally reviewed   Preoperative labs show sodium 137, normal creatinine 0.64.  Normal CBC Mildly elevated hemoglobin A1c 6.2  Assessment & Plan:  Acromegaly status post transsphenoidal pituitary tumor resection. Type 2 diabetes Obstructive sleep apnea Hypertension.  Plan:  -Patient is doing well with current pain regimen. -Stop IV fluids and progress diet. -Progressive ambulation. 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  Can remove arterial line and Foley catheter. -Likely ready to progress to floor from critical care standpoint.  Awaiting neurosurgery clearance.  Best Practice (right click and "Reselect all SmartList Selections" daily)   Diet/type: Regular consistency (see orders) DVT prophylaxis: prophylactic heparin  GI prophylaxis: N/A Lines: N/A Foley:  removal ordered  Code Status:  full code Last date of multidisciplinary goals of care discussion [per neurosurgery.]  Lynnell Catalan, MD [facility] ICU Physician [facility]  Pager: [phone] Or Epic Secure Chat After hours: [phone].  09/26/2023, 8:53 AM

## 2023-09-27 DIAGNOSIS — E220 Acromegaly and pituitary gigantism: Secondary | ICD-10-CM | POA: Diagnosis not present

## 2023-09-27 LAB — RENAL FUNCTION PANEL
Albumin: 3.2 g/dL — ABNORMAL LOW (ref 3.5–5.0)
Albumin: 3.3 g/dL — ABNORMAL LOW (ref 3.5–5.0)
Anion gap: 7 (ref 5–15)
Anion gap: 10 (ref 5–15)
BUN: 15 mg/dL (ref 6–20)
BUN: 10 mg/dL (ref 6–20)
CO2: 26 mmol/L (ref 22–32)
CO2: 26 mmol/L (ref 22–32)
Calcium: 8.9 mg/dL (ref 8.9–10.3)
Calcium: 9.2 mg/dL (ref 8.9–10.3)
Chloride: 106 mmol/L (ref 98–111)
Chloride: 103 mmol/L (ref 98–111)
Creatinine, Ser: 0.66 mg/dL (ref 0.44–1.00)
Creatinine, Ser: 0.68 mg/dL (ref 0.44–1.00)
GFR, Estimated: >60 mL/min (ref >60)
GFR, Estimated: >60 mL/min (ref >60)
Glucose, Bld: 111 mg/dL — ABNORMAL HIGH (ref 70–99)
Glucose, Bld: 136 mg/dL — ABNORMAL HIGH (ref 70–99)
Phosphorus: 3.4 mg/dL (ref 2.5–4.6)
Phosphorus: 3.4 mg/dL (ref 2.5–4.6)
Potassium: 4.2 mmol/L (ref 3.5–5.1)
Potassium: 4.4 mmol/L (ref 3.5–5.1)
Sodium: 139 mmol/L (ref 135–145)
Sodium: 139 mmol/L (ref 135–145)

## 2023-09-27 LAB — SODIUM
Sodium: 140 mmol/L (ref 135–145)
Sodium: 140 mmol/L (ref 135–145)
Sodium: 140 mmol/L (ref 135–145)

## 2023-09-27 LAB — OSMOLALITY, URINE
Osmolality, Ur: 823 mosm/kg (ref 300–900)
Osmolality, Ur: 929 mosm/kg — ABNORMAL HIGH (ref 300–900)
Osmolality, Ur: 216 mosm/kg — ABNORMAL LOW (ref 300–900)

## 2023-09-27 LAB — INSULIN-LIKE GROWTH FACTOR: Somatomedin C: 952 ng/mL — ABNORMAL HIGH (ref 60–207)

## 2023-09-27 MED ORDER — ORAL CARE MOUTH RINSE: 15.0000 mL | OROMUCOSAL | Status: DC | PRN

## 2023-09-27 MED ORDER — BUTALBITAL-APAP-CAFFEINE 50-325-40 MG PO TABS
1.0000 | ORAL_TABLET | ORAL | Status: DC | PRN
  Administered 2023-09-27 (×2): 2 via ORAL
  Administered 2023-09-27: 1 via ORAL
  Administered 2023-09-28 (×2): 2 via ORAL
  Filled 2023-09-27 (×3): qty 2
  Filled 2023-09-27: qty 1
  Filled 2023-09-27: qty 2

## 2023-09-27 NOTE — Progress Notes (Signed)
Patient with very low UOP about 50 over the last 3 hours and soft BP. Patient was in [place] yesterday and it appears she is fluid negative.   Dr. Danielle Dess aware, encourage fluids while awake, no new orders received. Care ongoing.

## 2023-09-27 NOTE — Progress Notes (Signed)
Patient ID: Rebecca Vargas, female   DOB: [birth year], 57 y.o.   MRN: [id number] Patient with episode of diabetes insipidus yesterday.  Has been given DDAVP.  Is having moderate amount of headache now and have suggested using some Fioricet for pain control as the other medications seem to make her nauseous particularly the opioids.  See how she does with this in the meantime of suggested simply observing her to see if the DI returns.  This is likely transient.  Maintained in the intensive care unit for now

## 2023-09-27 NOTE — Progress Notes (Addendum)
NAME:  Rebecca Vargas, MRN:  [id number], DOB:  [birth year], LOS: 2 ADMISSION DATE:  09/25/2023, CONSULTATION DATE:  09/25/2023 REFERRING MD:  Maurice Small - CNSA, CHIEF COMPLAINT:  diabetes care post op.   History of Present Illness:  57 year old woman who presented with progressive symptoms of acromegaly for the past several years. She complains of fatigue, joint and bone pain.  Excessive sweating.  She has noted increase in her shoe size and her dentist noticed prognathia.  Outpatient GH and IGF 1 levels were elevated under oral glucose tolerance test showed persistently elevated growth hormone levels.  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  She has sleep apnea and type 2 diabetes.  Pertinent  Medical History   Past Medical History:  Diagnosis Date   Acne    Acromegaly (HCC)    Anemia    Anxiety    Arthritis    Dyslipidemia    Family history of adverse reaction to anesthesia    mother- N/V , father- slow to wake up   Fibrocystic breast disease in female    Fibromyalgia    GERD (gastroesophageal reflux disease)    Hidradenitis    Migraine    Multinodular goiter    Pt denies   PMDD (premenstrual dysphoric disorder)    PONV (postoperative nausea and vomiting)    Sleep apnea    has cpap   Past Surgical History:  Procedure Laterality Date   CARPAL TUNNEL RELEASE  2006   CHOLECYSTECTOMY     CRANIOTOMY N/A 09/25/2023   Procedure: Endonasal endoscopic resection of pituitary mass;  Surgeon: Jadene Pierini, MD;  Location: [facility] OR;  Service: Neurosurgery;  Laterality: N/A;   HERNIA REPAIR     Age 21   POSTERIOR LUMBAR FUSION 4 LEVEL N/A 02/05/2023   Procedure: Open Thoracic Ten-Pelvis Instrumentation and Fusion; Lumbar One-Sacral One Posterior Column Osteotomies; Lumbar Two-Three, Lumbar Three-Four, Lumbar Four-Five, Lumbar Five-Sacral One Transforaminal Lumbar Interbody Fusions;  Surgeon: Dawley, Alan Mulder, DO;   Location: [facility] OR;  Service: Neurosurgery;  Laterality: N/A;   SEPTOPLASTY N/A 09/25/2023   Procedure: SEPTOPLASTY;  Surgeon: Laren Boom, DO;  Location: [facility] OR;  Service: ENT;  Laterality: N/A;   TOTAL HIP ARTHROPLASTY Right 02/12/2021   Procedure: TOTAL HIP ARTHROPLASTY ANTERIOR APPROACH;  Surgeon: Marcene Corning, MD;  Location: [facility] ORS;  Service: Orthopedics;  Laterality: Right;   TOTAL HIP ARTHROPLASTY Left 04/22/2022   Procedure: LEFT TOTAL HIP ARTHROPLASTY ANTERIOR APPROACH;  Surgeon: Marcene Corning, MD;  Location: [facility] ORS;  Service: Orthopedics;  Laterality: Left;   TRANSPHENOIDAL APPROACH EXPOSURE N/A 09/25/2023   Procedure: TRANSPHENOIDAL APPROACH EXPOSURE WITH FUSION;  Surgeon: Laren Boom, DO;  Location: [facility] OR;  Service: ENT;  Laterality: N/A;   WISDOM TOOTH EXTRACTION       Significant Hospital Events: Including procedures, antibiotic start and stop dates in addition to other pertinent events   10/18 endoscopic endonasal transsphenoidal resection of pituitary tumor with septoplasty 10/19 began to produce large doses of dilute urine consistent with central diabetes insipidus.  Given DDAVP.  Interim History / Subjective:  Continues to have headaches consistent with known migraines.  Less nasal pain.  Good response to DDAVP for polyuria.  Denies nasal drainage.  No visual disturbance.  Objective   Blood pressure 112/77, pulse 61, temperature 97.9 F (36.6 C), temperature source Axillary, resp. rate 17, height 5\' 6"  (1.676 m), weight 111 kg, SpO2  91%.        Intake/Output Summary (Last 24 hours) at 09/27/2023 0802 Last data filed at 09/27/2023 0400 Gross per 24 hour  Intake 1489.31 ml  Output 4475 ml  Net -2985.69 ml   Filed Weights   09/25/23 0650  Weight: 111 kg    Examination: General: obese woman, appears more comfortable than yesterday. HENT: nasal packing has been removed Lungs: clear bilaterally  Cardiovascular: HS normal with warm extremities.   Abdomen: soft with no tenderness Extremities: no edema or active joints  Neuro: non-focal exam.  GU: Foley in place.   Ancillary tests personally reviewed  Preoperative labs show sodium 139, normal creatinine 0.66.  Normal CBC Mildly elevated hemoglobin A1c 6.2 Dilute urine osmolarity at 212 prior to DDAVP back in normal range at 823 afterwards. Assessment & Plan:  Acromegaly status post transsphenoidal pituitary tumor resection. Type 2 diabetes Obstructive sleep apnea Hypertension.  Plan:  -Patient appears to have developed transient diabetes insipidus as is common with this type of surgery.  Appears to have responded to single dose DDAVP. -Keep in ICU today to monitor urine output and ensure she does not need further doses of DDAVP but otherwise progress her care in anticipation of discharge soon. -Holding home spironolactone due to diuretic effect.  She can resume at the time of discharge -Patient is doing well with current pain regimen.  Continue Imitrex for migraines.  If migraines persist will discuss patient using her home Maxalt with pharmacy.   Best Practice (right click and "Reselect all SmartList Selections" daily)   Diet/type: Regular consistency (see orders) DVT prophylaxis: prophylactic heparin  GI prophylaxis: N/A Lines: N/A Foley: N./A Code Status:  full code Last date of multidisciplinary goals of care discussion [per neurosurgery.]  Lynnell Catalan, MD [facility] ICU Physician [facility]  Pager: [phone] Or Epic Secure Chat After hours: [phone].  09/27/2023, 8:02 AM

## 2023-09-28 DIAGNOSIS — E220 Acromegaly and pituitary gigantism: Secondary | ICD-10-CM | POA: Diagnosis not present

## 2023-09-28 LAB — RENAL FUNCTION PANEL
Albumin: 3.2 g/dL — ABNORMAL LOW (ref 3.5–5.0)
Albumin: 3.4 g/dL — ABNORMAL LOW (ref 3.5–5.0)
Anion gap: 9 (ref 5–15)
Anion gap: 10 (ref 5–15)
BUN: 8 mg/dL (ref 6–20)
BUN: 9 mg/dL (ref 6–20)
CO2: 24 mmol/L (ref 22–32)
CO2: 26 mmol/L (ref 22–32)
Calcium: 9.5 mg/dL (ref 8.9–10.3)
Calcium: 9.3 mg/dL (ref 8.9–10.3)
Chloride: 108 mmol/L (ref 98–111)
Chloride: 106 mmol/L (ref 98–111)
Creatinine, Ser: 0.76 mg/dL (ref 0.44–1.00)
Creatinine, Ser: 0.75 mg/dL (ref 0.44–1.00)
GFR, Estimated: >60 mL/min (ref >60)
GFR, Estimated: >60 mL/min (ref >60)
Glucose, Bld: 122 mg/dL — ABNORMAL HIGH (ref 70–99)
Glucose, Bld: 90 mg/dL (ref 70–99)
Phosphorus: 3.7 mg/dL (ref 2.5–4.6)
Phosphorus: 4.1 mg/dL (ref 2.5–4.6)
Potassium: 3.9 mmol/L (ref 3.5–5.1)
Potassium: 3.8 mmol/L (ref 3.5–5.1)
Sodium: 141 mmol/L (ref 135–145)
Sodium: 142 mmol/L (ref 135–145)

## 2023-09-28 LAB — SURGICAL PATHOLOGY

## 2023-09-28 NOTE — Plan of Care (Signed)
  Problem: Activity: Goal: Ability to avoid complications of mobility impairment will improve Outcome: Progressing Goal: Ability to tolerate increased activity will improve Outcome: Progressing Goal: Will remain free from falls Outcome: Progressing   Problem: Pain Management: Goal: Pain level will decrease Outcome: Progressing   Problem: Skin Integrity: Goal: Will show signs of wound healing Outcome: Progressing   Problem: Clinical Measurements: Goal: Usual level of consciousness will be regained or maintained. Outcome: Progressing Goal: Neurologic status will improve Outcome: Progressing Goal: Ability to maintain intracranial pressure will improve Outcome: Progressing   Problem: Skin Integrity: Goal: Demonstration of wound healing without infection will improve Outcome: Progressing

## 2023-09-28 NOTE — Progress Notes (Signed)
NAME:  Rebecca Vargas, MRN:  [id number], DOB:  [birth year], LOS: 3 ADMISSION DATE:  09/25/2023, CONSULTATION DATE:  09/25/2023 REFERRING MD:  Maurice Small - CNSA, CHIEF COMPLAINT:  diabetes care post op.   History of Present Illness:  57 year old woman who presented with progressive symptoms of acromegaly for the past several years. She complains of fatigue, joint and bone pain.  Excessive sweating.  She has noted increase in her shoe size and her dentist noticed prognathia.  Outpatient GH and IGF 1 levels were elevated under oral glucose tolerance test showed persistently elevated growth hormone levels.  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  She has sleep apnea and type 2 diabetes.  Pertinent  Medical History   Past Medical History:  Diagnosis Date   Acne    Acromegaly (HCC)    Anemia    Anxiety    Arthritis    Dyslipidemia    Family history of adverse reaction to anesthesia    mother- N/V , father- slow to wake up   Fibrocystic breast disease in female    Fibromyalgia    GERD (gastroesophageal reflux disease)    Hidradenitis    Migraine    Multinodular goiter    Pt denies   PMDD (premenstrual dysphoric disorder)    PONV (postoperative nausea and vomiting)    Sleep apnea    has cpap   Past Surgical History:  Procedure Laterality Date   CARPAL TUNNEL RELEASE  2006   CHOLECYSTECTOMY     CRANIOTOMY N/A 09/25/2023   Procedure: Endonasal endoscopic resection of pituitary mass;  Surgeon: Jadene Pierini, MD;  Location: [facility] OR;  Service: Neurosurgery;  Laterality: N/A;   HERNIA REPAIR     Age 68   POSTERIOR LUMBAR FUSION 4 LEVEL N/A 02/05/2023   Procedure: Open Thoracic Ten-Pelvis Instrumentation and Fusion; Lumbar One-Sacral One Posterior Column Osteotomies; Lumbar Two-Three, Lumbar Three-Four, Lumbar Four-Five, Lumbar Five-Sacral One Transforaminal Lumbar Interbody Fusions;  Surgeon: Dawley, Alan Mulder, DO;   Location: [facility] OR;  Service: Neurosurgery;  Laterality: N/A;   SEPTOPLASTY N/A 09/25/2023   Procedure: SEPTOPLASTY;  Surgeon: Laren Boom, DO;  Location: [facility] OR;  Service: ENT;  Laterality: N/A;   TOTAL HIP ARTHROPLASTY Right 02/12/2021   Procedure: TOTAL HIP ARTHROPLASTY ANTERIOR APPROACH;  Surgeon: Marcene Corning, MD;  Location: [facility] ORS;  Service: Orthopedics;  Laterality: Right;   TOTAL HIP ARTHROPLASTY Left 04/22/2022   Procedure: LEFT TOTAL HIP ARTHROPLASTY ANTERIOR APPROACH;  Surgeon: Marcene Corning, MD;  Location: [facility] ORS;  Service: Orthopedics;  Laterality: Left;   TRANSPHENOIDAL APPROACH EXPOSURE N/A 09/25/2023   Procedure: TRANSPHENOIDAL APPROACH EXPOSURE WITH FUSION;  Surgeon: Laren Boom, DO;  Location: [facility] OR;  Service: ENT;  Laterality: N/A;   WISDOM TOOTH EXTRACTION       Significant Hospital Events: Including procedures, antibiotic start and stop dates in addition to other pertinent events   10/18 endoscopic endonasal transsphenoidal resection of pituitary tumor with septoplasty 10/19 began to produce large doses of dilute urine consistent with central diabetes insipidus.  Given DDAVP. 10/20 continued high urine output but able to keep up by drinking  Interim History / Subjective:  Headaches are improving. Urine output tapering off.   Objective   Blood pressure 133/87, pulse 79, temperature 97.8 F (36.6 C), temperature source Oral, resp. rate 15, height 5\' 6"  (1.676 m), weight 111 kg, SpO2 92%.  Intake/Output Summary (Last 24 hours) at 09/28/2023 1041 Last data filed at 09/28/2023 0900 Gross per 24 hour  Intake 1250 ml  Output 6000 ml  Net -4750 ml   Filed Weights   09/25/23 0650  Weight: 111 kg    Examination: General: obese woman, appears more comfortable than yesterday. HENT: nasal packing has been removed, no drainage Lungs: clear bilaterally  Cardiovascular: HS normal with warm extremities.  Abdomen: soft with no  tenderness Extremities: no edema or active joints  Neuro: non-focal exam.  GU: Foley in place.   Ancillary tests personally reviewed  Urine osmolarity back to 216.  Na stable at 141.  Assessment & Plan:  Acromegaly status post transsphenoidal pituitary tumor resection. Type 2 diabetes Obstructive sleep apnea Hypertension.  Plan:  -Patient appears to have developed transient diabetes insipidus as is common with this type of surgery.  Appears to have responded to single dose DDAVP. -Per neurosurgery, ready for transfer to stepdown. - No further DDAVP as patient is able to drink to thirst.  - BP and glucose are well controlled - may be improving now that [facility] levels are decreasing.   Best Practice (right click and "Reselect all SmartList Selections" daily)   Diet/type: Regular consistency (see orders) DVT prophylaxis: prophylactic heparin  GI prophylaxis: N/A Lines: N/A Foley: N./A Code Status:  full code Last date of multidisciplinary goals of care discussion [per neurosurgery.]  Lynnell Catalan, MD [facility] ICU Physician [facility]  Pager: [phone] Or Epic Secure Chat After hours: [phone].  09/28/2023, 10:41 AM

## 2023-09-28 NOTE — Op Note (Signed)
PATIENT: Rebecca Vargas   DAY OF SURGERY: 09/25/23   PRE-OPERATIVE DIAGNOSIS:  Pituitary adenoma with acromegaly   POST-OPERATIVE DIAGNOSIS:  Pituitary adenoma with acromegaly   PROCEDURE:  Endonasal endoscopic transphenoidal resection of pituitary tumor   SURGEON:  Surgeon(s) and Role:    Jadene Pierini, MD - Co-surgeon    Skotnicki, Lindie Spruce, MD - Co-surgeon   ANESTHESIA: ETGA   BRIEF HISTORY: This is a 57 year old woman who was diagnosed with acromegaly clinically due to typical physical features, IGF-1 was elevated and over 1600 and MRI confirmed a pituitary adenoma. We discussed the above and I recommended surgical resection. This was discussed with the patient as well as risks, benefits, and alternatives and wished to proceed with surgical treatment.   OPERATIVE DETAIL: Dr. Marene Lenz performed the nasal portion of the approach and is dictated separately.    For the neurosurgical portion of the case, Dr. Marene Lenz and I used a combination of visual anatomic landmarks and frameless stereotaxy to confirm orientation and anatomy. The remainder of the rostral face of the sella was resected. The dura of the sella was then opened sharply with a #15 blade in a cruciate fashion. Tumor was immediately encountered and samples were taken and sent to pathology for analysis. With Dr. Anders Simmonds assistance while holding the endoscope, the tumor was then resected in a deliberate fashion with bimanual technique using a combination of ring curettes, suction, and penfield #4. The tumor produced itself due to pressure through the durotomy and multiple samples were sent to pathology. It was then further resected until the only residual was what appeared to be normal gland in the left posterior aspect of the sella. Its appearance was grossly consistent with normal gland and appeared to be connected to the diaphragma at the stalk, so this was obviously left in place. At the end of resection, there was  pulsatile diaphragma in the field. There was no evidence of CSF leakage. Hemostasis was confirmed, floseal was used to gently fill the sella and Adheris ([employer]) was used to cover the sellar defect. Dr. Marene Lenz then took over to complete the skull base reconstruction portion of the procedure.    EBL:  50mL   DRAINS: none   SPECIMENS: Pituitary tumor   Jadene Pierini, MD 09/25/23 8:50 AM

## 2023-09-28 NOTE — TOC CM/SW Note (Signed)
Transition of Care [facility]) - Inpatient Brief Assessment   Patient Details  Name: Rebecca Vargas MRN: [id number] Date of Birth: [birth year]  Transition of [facility]) CM/SW Contact:    Mearl Latin, LCSW Phone Number: 09/28/2023, 4:51 PM   Clinical Narrative: Patient admitted from home with spouse. TOC continuing to follow for discharge needs.    Transition of Care Asessment: Insurance and Status: Insurance coverage has been reviewed Patient has primary care physician: Yes Home environment has been reviewed: From home Prior level of function:: Independent Prior/Current Home Services: No current home services Social Determinants of Health Reivew: SDOH reviewed no interventions necessary Readmission risk has been reviewed: Yes Transition of care needs: transition of care needs identified, TOC will continue to follow

## 2023-09-28 NOTE — Progress Notes (Signed)
Neurosurgery Service Progress Note  Subjective: No acute events overnight, still having polyuria but Na stable   Objective: Vitals:   09/28/23 0500 09/28/23 0600 09/28/23 0700 09/28/23 0800  BP: (!) 136/96 (!) 126/111 139/87 133/87  Pulse: 67 75 74 79  Resp: 12 13 17 15   Temp:      TempSrc:      SpO2: 93% 94% 95% 92%  Weight:      Height:        Physical Exam: Aox3, PERRL, EOMI, FS & SS, VFF to confrontation, strength 5/5x4, no rhinorrhea  Assessment & Plan: 57 y.o. woman w/ acromegaly 2/2 pituitary adenoma s/p endonasal endoscopic resection, post-op polyuria but Na stable.   -will keep inpatient to follow the polyuria, likely DI with intact thirst mechanism, can d/c the foley but continue strict I's & Os -4L neg over past 24h but stable Na and no signs/sx of hypovolemia -RFPs bid, pitcher of water at bedside at all times -first IGF-1 is about half of preop and was drawn 18h or so post-op, which is a good start - with t1/2 of 12-18h, that is on track towards a low nadir, can stop checking daily - can take a few months to completely normalize but we have 72h of data cooking -transfer to stepdown, keep inpatient until the polyuria either resolves or confident that she can maintain normonatremia -SCDs/TEDs, SQH  Jadene Pierini  09/28/23 9:48 AM

## 2023-09-29 LAB — RENAL FUNCTION PANEL
Albumin: 3.4 g/dL — ABNORMAL LOW (ref 3.5–5.0)
Anion gap: 12 (ref 5–15)
BUN: 8 mg/dL (ref 6–20)
CO2: 26 mmol/L (ref 22–32)
Calcium: 9.6 mg/dL (ref 8.9–10.3)
Chloride: 104 mmol/L (ref 98–111)
Creatinine, Ser: 0.69 mg/dL (ref 0.44–1.00)
GFR, Estimated: >60 mL/min (ref >60)
Glucose, Bld: 99 mg/dL (ref 70–99)
Phosphorus: 4.4 mg/dL (ref 2.5–4.6)
Potassium: 4.4 mmol/L (ref 3.5–5.1)
Sodium: 142 mmol/L (ref 135–145)

## 2023-09-29 LAB — INSULIN-LIKE GROWTH FACTOR
Somatomedin C: 831 ng/mL — ABNORMAL HIGH (ref 60–207)
Somatomedin C: 877 ng/mL — ABNORMAL HIGH (ref 60–207)

## 2023-09-29 MED ORDER — HYDROCODONE-ACETAMINOPHEN 5-325 MG PO TABS: 1.0000 | ORAL_TABLET | ORAL | 0 refills | Status: AC | PRN

## 2023-09-29 NOTE — Discharge Summary (Signed)
Discharge Summary  Date of Admission: 09/25/2023  Date of Discharge: 09/29/23  Attending Physician: Autumn Patty, MD  Hospital Course: Patient was admitted following an uncomplicated endoscopic endonasal pituitary adenoma resection for acromegaly. They were recovered in PACU and transferred to 4N. Vision was stable to preop, IGF-1 was downtrending, she did have some post-operative polyuria without an abnormal sodium level with intact thirst mechanism that required one dose of DDAVP, but was otherwise stable and therefore discharged home with a plan to get a repeat RFP in 2-3 days.   Neurologic exam at discharge:  Aox3, PERRL, EOMI, FS & SS, VFF to confrontation, strength 5/5x4, no rhinorrhea   Discharge diagnosis: Acromegaly due to pituitary adenoma   Jadene Pierini, MD 09/29/23 9:07 AM

## 2023-09-29 NOTE — Progress Notes (Signed)
Neurosurgery Service Progress Note  Subjective: No acute events overnight, UOP slowing down, thirst mechanism intact, states she's drinking a fair amount of water but she does that at baseline as well   Objective: Vitals:   09/28/23 2030 09/28/23 2308 09/29/23 0300 09/29/23 0700  BP: 123/86 (!) 119/93 109/72 (!) 107/55  Pulse: 78 77 67 64  Resp: 18 18 16 16   Temp: 98.2 F (36.8 C) 98.4 F (36.9 C) 97.8 F (36.6 C) 98.3 F (36.8 C)  TempSrc: Oral Oral Oral Axillary  SpO2: 93% 93% 92% 91%  Weight:      Height:        Physical Exam: Aox3, PERRL, EOMI, FS & SS, VFF to confrontation, strength 5/5x4, no rhinorrhea  Assessment & Plan: 57 y.o. woman w/ acromegaly 2/2 pituitary adenoma s/p endonasal endoscopic resection, post-op polyuria but Na stable.   -Na stable, UOP slowing down, no signs/sx dehydration, IGF-1 downtrending. Discussed w/ the pt, she's okay with going home and getting a repeat RFP when she sees Dr. Marene Lenz in follow up later this week -discharge home today  Jadene Pierini  09/29/23 9:00 AM

## 2023-09-29 NOTE — Progress Notes (Signed)
Pt has voided since foley removed this morning.  DC paperwork, post op instructions, med list reviewed with pt who verbalized understanding.  Tele and IV removed.  Pt's ride is on the way.  All belongings returned, including glasses, cell phone and charger cord, clothing, shoes.

## 2023-09-30 LAB — INSULIN-LIKE GROWTH FACTOR: Somatomedin C: 741 ng/mL — ABNORMAL HIGH (ref 60–207)

## 2024-01-11 ENCOUNTER — Other Ambulatory Visit: Payer: Self-pay | Admitting: Nurse Practitioner

## 2024-01-11 DIAGNOSIS — D352 Benign neoplasm of pituitary gland: Secondary | ICD-10-CM

## 2024-01-12 ENCOUNTER — Ambulatory Visit
Admission: RE | Admit: 2024-01-12 | Discharge: 2024-01-12 | Disposition: A | Discharge status: Home / Self Care | Payer: Commercial Managed Care - HMO | Source: Ambulatory Visit | Attending: Nurse Practitioner

## 2024-01-12 DIAGNOSIS — D352 Benign neoplasm of pituitary gland: Secondary | ICD-10-CM

## 2024-04-15 IMAGING — RF DG HIP (WITH OR WITHOUT PELVIS) 1V*L*
1 series · 2 of 2 positions shown · non-contrast
Comparison: January 23, 2021.

CLINICAL DATA: Postoperative LEFT hip arthroplasty.

EXAM:
DG HIP (WITH OR WITHOUT PELVIS) 1V*L*; DG C-ARM 1-60 MIN-NO REPORT

[Series 1: unknown protocol · 0.20mm/px · 2 of 2 slices shown]
[im 1/2]
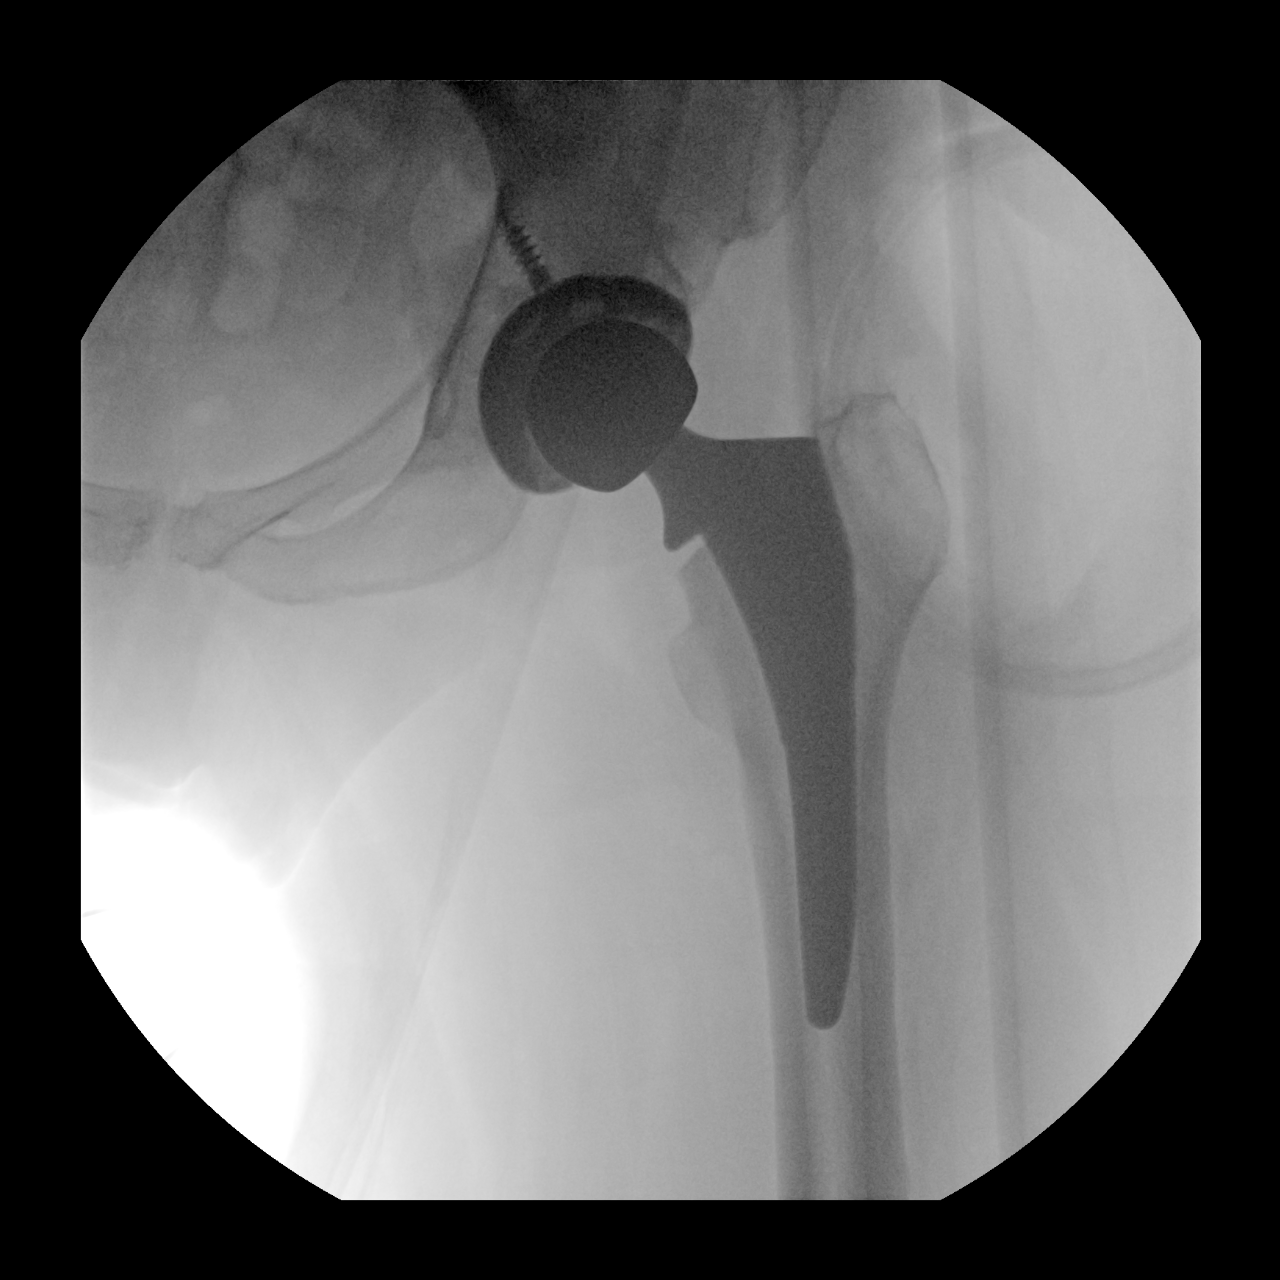
[im 2/2]
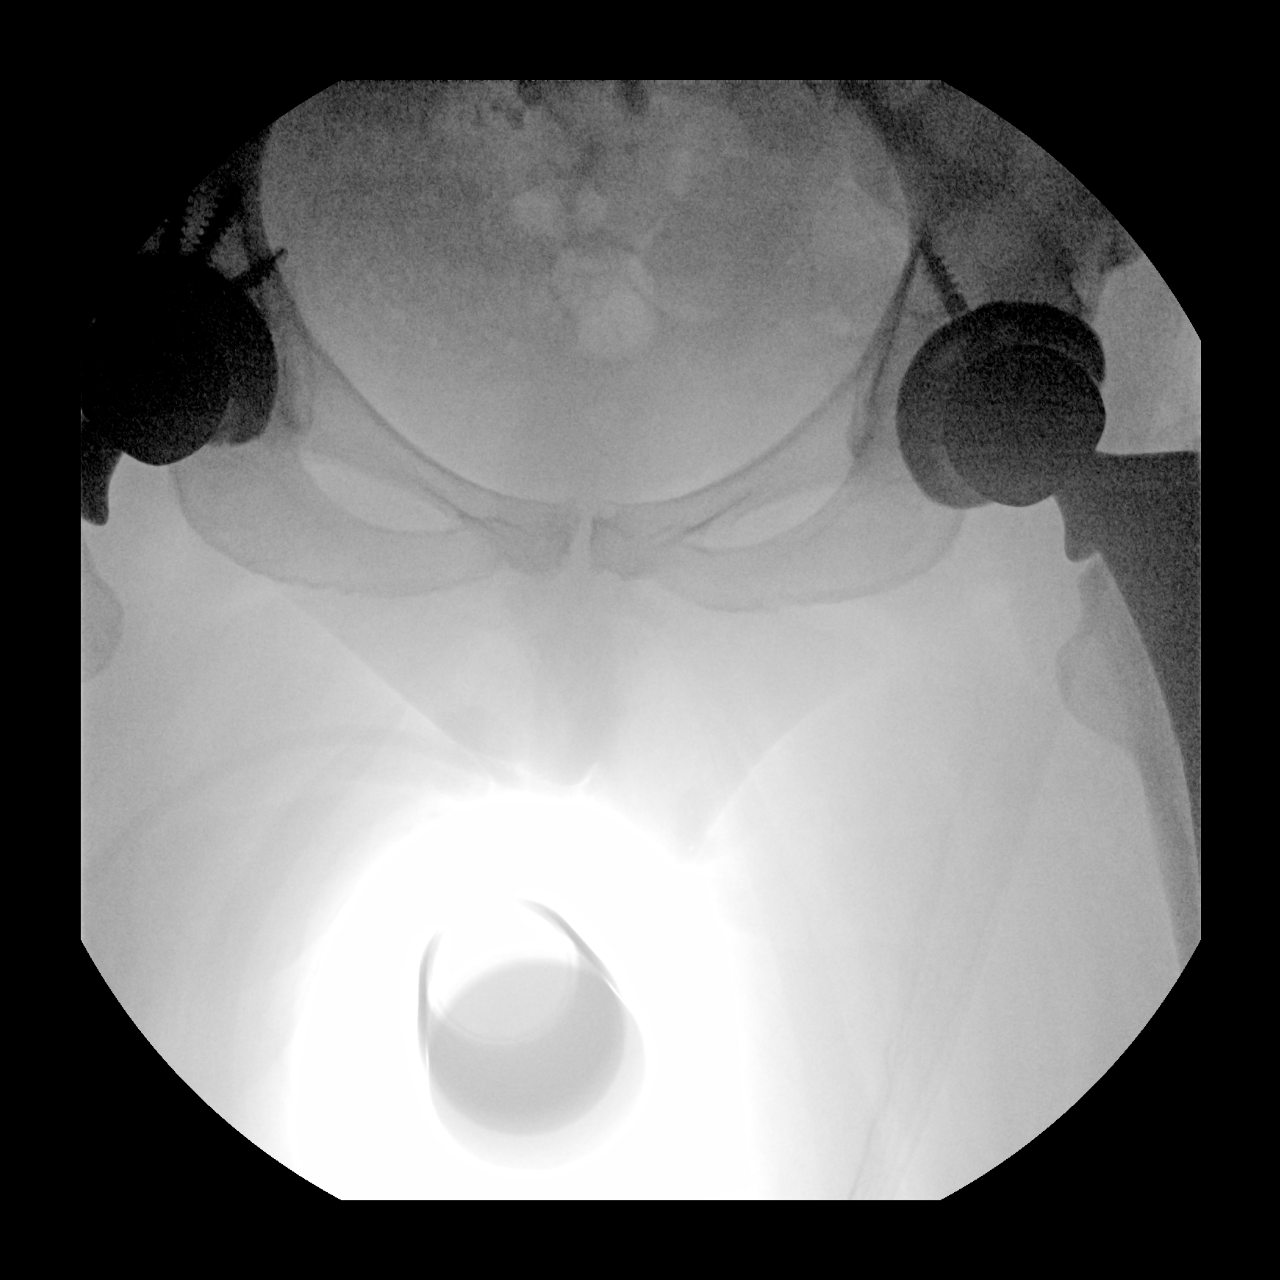

[2 of 2 positions shown; findings below may reference images not displayed]

FINDINGS: Limited intraoperative fluoroscopic views are provided following
LEFT hip arthroplasty. Two total images. Low AP pelvis shows
bilateral hip arthroplasty changes. LEFT hip arthroplasty on
subsequent AP projection appears well positioned without unexpected
radiographic findings in the AP projection.

Surgical drains project over the soft tissues. Lucency compatible
with gas in the soft tissues in the immediate postoperative setting.
IMPRESSION: Limited intraoperative fluoroscopic views following LEFT hip
arthroplasty. No unexpected radiographic findings.

Fluoroscopic dose: 4.9 mGy

Fluoroscopic time: 16 seconds

## 2024-09-07 ENCOUNTER — Other Ambulatory Visit: Payer: Self-pay | Admitting: Obstetrics and Gynecology

## 2024-09-07 DIAGNOSIS — Z1231 Encounter for screening mammogram for malignant neoplasm of breast: Secondary | ICD-10-CM

## 2024-09-07 DIAGNOSIS — N950 Postmenopausal bleeding: Secondary | ICD-10-CM

## 2024-09-13 ENCOUNTER — Ambulatory Visit
Admission: RE | Admit: 2024-09-13 | Discharge: 2024-09-13 | Disposition: A | Source: Ambulatory Visit | Attending: Obstetrics and Gynecology | Admitting: Obstetrics and Gynecology

## 2024-09-13 DIAGNOSIS — N950 Postmenopausal bleeding: Secondary | ICD-10-CM
